# Patient Record
Sex: Female | Born: 1935
Health system: Southern US, Community
[De-identification: ages and names within clinical notes are randomized; demographics above are authoritative.]

---

## 2014-09-13 ENCOUNTER — Inpatient Hospital Stay
Admission: AD | Admit: 2014-09-13 | Discharge: 2014-11-23 | Disposition: E | Payer: Self-pay | Source: Other Acute Inpatient Hospital | Attending: Internal Medicine | Admitting: Internal Medicine

## 2014-09-13 DIAGNOSIS — R609 Edema, unspecified: Secondary | ICD-10-CM

## 2014-09-13 DIAGNOSIS — R0603 Acute respiratory distress: Secondary | ICD-10-CM

## 2014-09-13 DIAGNOSIS — R0602 Shortness of breath: Secondary | ICD-10-CM

## 2014-09-13 DIAGNOSIS — J189 Pneumonia, unspecified organism: Secondary | ICD-10-CM

## 2014-09-13 DIAGNOSIS — E8770 Fluid overload, unspecified: Secondary | ICD-10-CM

## 2014-09-13 DIAGNOSIS — H05342 Enlargement of left orbit: Secondary | ICD-10-CM

## 2014-09-13 DIAGNOSIS — N009 Acute nephritic syndrome with unspecified morphologic changes: Secondary | ICD-10-CM

## 2014-09-13 DIAGNOSIS — J9690 Respiratory failure, unspecified, unspecified whether with hypoxia or hypercapnia: Secondary | ICD-10-CM

## 2014-09-13 DIAGNOSIS — Z431 Encounter for attention to gastrostomy: Secondary | ICD-10-CM

## 2014-09-13 DIAGNOSIS — J9600 Acute respiratory failure, unspecified whether with hypoxia or hypercapnia: Secondary | ICD-10-CM

## 2014-09-13 DIAGNOSIS — L700 Acne vulgaris: Secondary | ICD-10-CM

## 2014-09-14 ENCOUNTER — Other Ambulatory Visit (HOSPITAL_COMMUNITY): Payer: Self-pay

## 2014-09-14 LAB — CBC
HEMATOCRIT: 27.7 % — AB (ref 36.0–46.0)
Hemoglobin: 8.4 g/dL — ABNORMAL LOW (ref 12.0–15.0)
MCH: 30.4 pg (ref 26.0–34.0)
MCHC: 30.3 g/dL (ref 30.0–36.0)
MCV: 100.4 fL — AB (ref 78.0–100.0)
Platelets: 253 10*3/uL (ref 150–400)
RBC: 2.76 MIL/uL — ABNORMAL LOW (ref 3.87–5.11)
RDW: 16.9 % — ABNORMAL HIGH (ref 11.5–15.5)
WBC: 6.2 10*3/uL (ref 4.0–10.5)

## 2014-09-14 LAB — COMPREHENSIVE METABOLIC PANEL
ALBUMIN: 2.4 g/dL — AB (ref 3.5–5.0)
ALT: 23 U/L (ref 14–54)
AST: 20 U/L (ref 15–41)
Alkaline Phosphatase: 75 U/L (ref 38–126)
Anion gap: 9 (ref 5–15)
BUN: 17 mg/dL (ref 6–20)
CALCIUM: 8.8 mg/dL — AB (ref 8.9–10.3)
CO2: 29 mmol/L (ref 22–32)
CREATININE: 1.92 mg/dL — AB (ref 0.44–1.00)
Chloride: 100 mmol/L — ABNORMAL LOW (ref 101–111)
GFR calc Af Amer: 27 mL/min — ABNORMAL LOW (ref >60)
GFR calc non Af Amer: 24 mL/min — ABNORMAL LOW (ref >60)
Glucose, Bld: 88 mg/dL (ref 65–99)
Potassium: 4.7 mmol/L (ref 3.5–5.1)
Sodium: 138 mmol/L (ref 135–145)
TOTAL PROTEIN: 5.3 g/dL — AB (ref 6.5–8.1)
Total Bilirubin: 0.5 mg/dL (ref 0.3–1.2)

## 2014-09-14 MED ORDER — IOHEXOL 300 MG/ML  SOLN
50.0000 mL | Freq: Once | INTRAMUSCULAR | Status: AC | PRN
  Administered 2014-09-14: 50 mL via INTRAVENOUS

## 2014-09-15 ENCOUNTER — Other Ambulatory Visit (HOSPITAL_COMMUNITY): Payer: Self-pay

## 2014-09-15 MED ORDER — LIDOCAINE VISCOUS 2 % MT SOLN
OROMUCOSAL | Status: AC
  Filled 2014-09-15: qty 15

## 2014-09-15 MED ORDER — IOHEXOL 300 MG/ML  SOLN
50.0000 mL | Freq: Once | INTRAMUSCULAR | Status: AC | PRN
  Administered 2014-09-15: 10 mL via INTRAVENOUS

## 2014-09-15 NOTE — Procedures (Signed)
Successful fluoroscopic guided replacement of 18 Fr gastrostomy tube.  No immediate post procedural complications.  The feeding tube is ready for immediate use. 

## 2014-09-15 NOTE — Consult Note (Signed)
[facility] CONSULT NOTE    Date: 09/15/2014                  Patient Name:  Bailey Calderon  MRN: [id number]  DOB: September 21, 1933  Age / Sex: 81 y.o., female         PCP: No PCP Per Patient                 Service Requesting Consult: Internal Medicine                 Reason for Consult: Recent Acute renal failure            History of Present Illness: Patient is a 81 y.o. female with a PMHx of myeloma status post autologous stem cell transplantation, who was admitted to [place] [facility] on 08/23/2014 for ongoing care. The patient chief as well as position and [facility]. 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 She was apparently on dialysis for a period of time however her creatinine appears to be trending down now. She has a PermCath in place. She has good urine output however. We are asked evaluate her for dialysis today.   Medications: Outpatient medications: No prescriptions prior to admission    Current medications: Current Facility-Administered Medications  Medication Dose Route Frequency Provider Last Rate Last Dose  . lidocaine (XYLOCAINE) 2 % viscous mouth solution             Amiodarone 266m po daily Aspirin 868mpo daily Atorvastatin 2065KPo qhs Folic acid 12m75mo daily Loratadine 29m17ms Methylprednisolone 30mg62mBID Senna 8.6mg p57mid Thiamine 100mg p32mily Vitamin D 3000 IU daily  Allergies: No known drug allergies    Past Medical History: Acute renal failure due to acute glomerulonephritis syndrome with immune complex deposition in setting of type III hypersensitiviy reaction Atrial fibrillation Acute respiratory failure Aspiration pneumonia   Past Surgical History: Bilateral cataract removal Right carpal tunnel release T12 and L2 vertebroplasty Vaginal hysterectomy secondary  to uterine prolapse Status post tonsillectomy 1942   Family History: Unable to obtain directly from the patient.  Social History: Married, lives with her husband with dementia.  Review of Systems: Pt poor historian and unable to concentrate on ROS questions and unable to provide accurate ROS.    Vital Signs: 97.681 22 152/84 Weight trends: There were no vitals filed for this visit.  Physical Exam: General: Frail appearing  Head: Normocephalic, atraumatic.  Eyes: Anicteric, EOMI, PERRL  Nose: Mucous membranes moist, not inflammed, nonerythematous.  Throat: Oropharynx nonerythematous, no exudate appreciated.   Neck: Supple, trachea midline  Lungs:  Basilar rales, normal effort  Heart: RRR. S1 and S2 2/6 SEM.  Abdomen:  BS normoactive. Soft, Nondistended, non-tender.  PEG present  Extremities: 1+ b/l LE edema.  Neurologic: Awake, alert, follows simple commands  Skin: No visible rashes, scars.   Psych:            Normal affect, limited insight  Lab results: Basic Metabolic Panel:  Recent Labs Lab 09/14/14 0623  NA 138  K 4.7  CL 100*  CO2 29  GLUCOSE 88  BUN 17  CREATININE 1.92*  CALCIUM 8.8*    Liver Function Tests:  Recent Labs Lab 09/14/14 0623  AST 20  ALT 23  ALKPHOS 75  BILITOT 0.5  PROT 5.3*  ALBUMIN 2.4*   No results  for input(s): LIPASE, AMYLASE in the last 168 hours. No results for input(s): AMMONIA in the last 168 hours.  CBC:  Recent Labs Lab 09/14/14 0623  WBC 6.2  HGB 8.4*  HCT 27.7*  MCV 100.4*  PLT 253    Cardiac Enzymes: No results for input(s): CKTOTAL, CKMB, CKMBINDEX, TROPONINI in the last 168 hours.  BNP: Invalid input(s): POCBNP  CBG: No results for input(s): GLUCAP in the last 168 hours.  Microbiology: No results found for this or any previous visit.  Coagulation Studies: No results for input(s): LABPROT, INR in the last 72 hours.  Urinalysis: No results for input(s): COLORURINE, LABSPEC, PHURINE, GLUCOSEU,  HGBUR, BILIRUBINUR, KETONESUR, PROTEINUR, UROBILINOGEN, NITRITE, LEUKOCYTESUR in the last 72 hours.  Invalid input(s): APPERANCEUR    Imaging: Ir Replc Gastro/colonic Tube Percut W/fluoro  09/15/2014   INDICATION: Gastrostomy tube fell out. Gastrostomy tract maintained with a Foley catheter. Please performed fluoroscopic guided gastrostomy tube exchange  EXAM: FLUOROSCOPIC GUIDED REPLACEMENT OF GASTROSTOMY TUBE  COMPARISON:  Abdominal radiograph- 09/14/2014  MEDICATIONS: None.  CONTRAST:  71m OMNIPAQUE IOHEXOL 300 MG/ML SOLN administered into the gastric lumen  FLUOROSCOPY TIME:  36 seconds (21 mGy)  COMPLICATIONS: None immediate  PROCEDURE: Informed written consent was obtained from the patient after a discussion of the risks, benefits and alternatives to treatment. Questions regarding the procedure were encouraged and answered. A timeout was performed prior to the initiation of the procedure.  The upper abdomen and external portion of the existing gastrostomy tube was prepped and draped in the usual sterile fashion, and a sterile drape was applied covering the operative field. Maximum barrier sterile technique with sterile gowns and gloves were used for the procedure. A timeout was performed prior to the initiation of the procedure.  The existing gastrostomy tube was injected with a small amount of contrast confirming appropriate positioning within the gastric lumen. The existing gastrostomy tube was cannulated with a stiff Glidewire which was coiled within the gastric fundus. Over the stiff guide wire, the existing gastrostomy tube was removed and exchanged for a new 20-French MIC balloon inflatable gastrostomy tube. The balloon was inflated and disc was cinched. Contrast was injected and several spot fluoroscopic images were obtained in various obliquities to confirm appropriate intraluminal positioning. A dressing was placed. The patient tolerated the procedure well without immediate postprocedural  complication.  IMPRESSION: Successful fluoroscopic guided replacement of a new 20-French gastrostomy tube. The new gastrostomy tube is ready for immediate use.   Electronically Signed   By: JSandi MariscalM.D.   On: 09/15/2014 12:49   Dg Chest Port 1 View  09/14/2014   CLINICAL DATA:  Acute renal failure.  Respiratory failure  EXAM: PORTABLE CHEST - 1 VIEW  COMPARISON:  None.  FINDINGS: Right jugular catheter tips at the cavoatrial junction. No pneumothorax.  Mild bibasilar atelectasis. Negative for pulmonary edema or effusion. Negative for pneumonia.  Prior cement vertebral augmentation at multiple levels of the thoracic spine  IMPRESSION: Central venous catheter tip at the cavoatrial junction. Negative for edema.  Mild bibasilar atelectasis.   Electronically Signed   By: CFranchot GalloM.D.   On: 09/14/2014 08:01   Dg Abd Portable 1v  09/14/2014   CLINICAL DATA:  Evaluate PEG tube placement  EXAM: PORTABLE ABDOMEN - 1 VIEW  COMPARISON:  None.  FINDINGS: A portable film of the abdomen shows contrast has been injected via the PEG tube, by history 50 cc of Omnipaque 300 were injected. The contrast does fill a portion of  the stomach and extends into the small bowel. Therefore no extravasation is seen. The bowel gas pattern is nonspecific.  IMPRESSION: The tip of the PEG tube appears to be with within the stomach with no extravasation noted.   Electronically Signed   By: Ivar Drape M.D.   On: 09/14/2014 07:57      Assessment & Plan: Pt is a 81 y.o. yo female with a PMHX of multiple myeloma status post autologous stem cell transplantation, recent acute renal failure secondary to acute glomerulonephritis syndrome with immune complex deposition in the setting of type III hypersensitivity reaction, recent acute respiratory failure, was [facility]/2014 for ongoing management.   1. Acute renal failure secondary to glomerulonephritis syndrome with immune complex deposition in  the setting of type III hypersensitivity reaction. 2. Anemia unspecified.  Plan: We do not have the discharge summary from [facility] to review at this point in time however it appears that she was found to have acute renal failure secondary to glomerulonephritis syndrome with an incompetent position the setting of type III hypersensitivity reaction. The patient is noted to be on steroid therapy at this time. The patient appears to have improving renal function and has good urine output. Therefore no acute indication for dialysis at present. We'll monitor renal function trend over the weekend.  We will reassess renal function on Monday. Patient also noted to be anemic in the setting of recent acute illness and history of multiple myeloma. We'll continue to follow CBC and transfuse for hemoglobin of 7 or less. Thanks for consult.

## 2014-09-16 LAB — BASIC METABOLIC PANEL
Anion gap: 8 (ref 5–15)
BUN: 47 mg/dL — AB (ref 6–20)
CO2: 29 mmol/L (ref 22–32)
CREATININE: 3.53 mg/dL — AB (ref 0.44–1.00)
Calcium: 8.3 mg/dL — ABNORMAL LOW (ref 8.9–10.3)
Chloride: 96 mmol/L — ABNORMAL LOW (ref 101–111)
GFR calc Af Amer: 13 mL/min — ABNORMAL LOW (ref >60)
GFR, EST NON AFRICAN AMERICAN: 11 mL/min — AB (ref >60)
Glucose, Bld: 144 mg/dL — ABNORMAL HIGH (ref 65–99)
Potassium: 5.4 mmol/L — ABNORMAL HIGH (ref 3.5–5.1)
SODIUM: 133 mmol/L — AB (ref 135–145)

## 2014-09-16 LAB — BRAIN NATRIURETIC PEPTIDE: B NATRIURETIC PEPTIDE 5: 1964.4 pg/mL — AB (ref 0.0–100.0)

## 2014-09-16 LAB — CBC
HCT: 28.4 % — ABNORMAL LOW (ref 36.0–46.0)
Hemoglobin: 8.7 g/dL — ABNORMAL LOW (ref 12.0–15.0)
MCH: 29.9 pg (ref 26.0–34.0)
MCHC: 30.6 g/dL (ref 30.0–36.0)
MCV: 97.6 fL (ref 78.0–100.0)
Platelets: 291 10*3/uL (ref 150–400)
RBC: 2.91 MIL/uL — AB (ref 3.87–5.11)
RDW: 16.4 % — AB (ref 11.5–15.5)
WBC: 12.4 10*3/uL — AB (ref 4.0–10.5)

## 2014-09-17 LAB — BASIC METABOLIC PANEL
ANION GAP: 6 (ref 5–15)
BUN: 23 mg/dL — ABNORMAL HIGH (ref 6–20)
CO2: 30 mmol/L (ref 22–32)
CREATININE: 1.87 mg/dL — AB (ref 0.44–1.00)
Calcium: 7.6 mg/dL — ABNORMAL LOW (ref 8.9–10.3)
Chloride: 98 mmol/L — ABNORMAL LOW (ref 101–111)
GFR calc non Af Amer: 24 mL/min — ABNORMAL LOW (ref >60)
GFR, EST AFRICAN AMERICAN: 28 mL/min — AB (ref >60)
Glucose, Bld: 127 mg/dL — ABNORMAL HIGH (ref 65–99)
POTASSIUM: 4.7 mmol/L (ref 3.5–5.1)
SODIUM: 134 mmol/L — AB (ref 135–145)

## 2014-09-17 LAB — HEPATITIS B CORE ANTIBODY, IGM: Hep B C IgM: NEGATIVE

## 2014-09-17 LAB — HEPATITIS B SURFACE ANTIGEN: HEP B S AG: NEGATIVE

## 2014-09-17 LAB — HEPATITIS B SURFACE ANTIBODY,QUALITATIVE: Hep B S Ab: NONREACTIVE

## 2014-09-18 LAB — CBC
HCT: 29.4 % — ABNORMAL LOW (ref 36.0–46.0)
HEMATOCRIT: 29.8 % — AB (ref 36.0–46.0)
HEMOGLOBIN: 9.1 g/dL — AB (ref 12.0–15.0)
Hemoglobin: 9 g/dL — ABNORMAL LOW (ref 12.0–15.0)
MCH: 30.6 pg (ref 26.0–34.0)
MCH: 30.3 pg (ref 26.0–34.0)
MCHC: 30.5 g/dL (ref 30.0–36.0)
MCHC: 30.6 g/dL (ref 30.0–36.0)
MCV: 100.3 fL — ABNORMAL HIGH (ref 78.0–100.0)
MCV: 99 fL (ref 78.0–100.0)
Platelets: 257 10*3/uL (ref 150–400)
Platelets: 305 10*3/uL (ref 150–400)
RBC: 2.97 MIL/uL — ABNORMAL LOW (ref 3.87–5.11)
RBC: 2.97 MIL/uL — AB (ref 3.87–5.11)
RDW: 16.2 % — AB (ref 11.5–15.5)
RDW: 16 % — AB (ref 11.5–15.5)
WBC: 10.8 10*3/uL — AB (ref 4.0–10.5)
WBC: 10.9 10*3/uL — ABNORMAL HIGH (ref 4.0–10.5)

## 2014-09-18 LAB — RENAL FUNCTION PANEL
ANION GAP: 6 (ref 5–15)
Albumin: 2.6 g/dL — ABNORMAL LOW (ref 3.5–5.0)
Albumin: 2.6 g/dL — ABNORMAL LOW (ref 3.5–5.0)
Anion gap: 8 (ref 5–15)
BUN: 49 mg/dL — ABNORMAL HIGH (ref 6–20)
BUN: 51 mg/dL — ABNORMAL HIGH (ref 6–20)
CHLORIDE: 96 mmol/L — AB (ref 101–111)
CO2: 27 mmol/L (ref 22–32)
CO2: 28 mmol/L (ref 22–32)
CREATININE: 2.69 mg/dL — AB (ref 0.44–1.00)
CREATININE: 2.7 mg/dL — AB (ref 0.44–1.00)
Calcium: 7.4 mg/dL — ABNORMAL LOW (ref 8.9–10.3)
Calcium: 7.4 mg/dL — ABNORMAL LOW (ref 8.9–10.3)
Chloride: 94 mmol/L — ABNORMAL LOW (ref 101–111)
GFR calc Af Amer: 18 mL/min — ABNORMAL LOW (ref >60)
GFR calc Af Amer: 18 mL/min — ABNORMAL LOW (ref >60)
GFR calc non Af Amer: 16 mL/min — ABNORMAL LOW (ref >60)
GFR, EST NON AFRICAN AMERICAN: 16 mL/min — AB (ref >60)
GLUCOSE: 154 mg/dL — AB (ref 65–99)
Glucose, Bld: 142 mg/dL — ABNORMAL HIGH (ref 65–99)
Phosphorus: 5.2 mg/dL — ABNORMAL HIGH (ref 2.5–4.6)
Phosphorus: 5.2 mg/dL — ABNORMAL HIGH (ref 2.5–4.6)
Potassium: 6.5 mmol/L (ref 3.5–5.1)
Potassium: 5 mmol/L (ref 3.5–5.1)
SODIUM: 130 mmol/L — AB (ref 135–145)
Sodium: 129 mmol/L — ABNORMAL LOW (ref 135–145)

## 2014-09-18 LAB — POTASSIUM: Potassium: 5 mmol/L (ref 3.5–5.1)

## 2014-09-18 NOTE — Progress Notes (Signed)
  Subjective:   Doing fair Sitting up in chair No acute c/o   Objective:  Vital signs in last 24 hours:   97.4  74  20  124/65  Weight change:  There were no vitals filed for this visit.  Intake/Output:       Physical Exam: General: NAD, sitting up  HEENT Moist mucus membranes  Neck supple  Pulm/lungs Clear b/l  CVS/Heart Regular, no rub  Abdomen:  Soft, non deistended, PEG ion place  Extremities: No significant edema  Neurologic: Alert, non focal  Skin: No acute rashes  Access: Rt Ij permcath       Basic Metabolic Panel:  Recent Labs Lab 09/14/14 0623 09/16/14 0430 09/17/14 0630 09/18/14 0601 09/18/14 0824  NA 138 133* 134* 129* 130*  K 4.7 5.4* 4.7 6.5* 5.0  5.0  CL 100* 96* 98* 96* 94*  CO2 $Re'29 29 30 27 28  'tjL$ GLUCOSE 88 144* 127* 154* 142*  BUN 17 47* 23* 49* 51*  CREATININE 1.92* 3.53* 1.87* 2.69* 2.70*  CALCIUM 8.8* 8.3* 7.6* 7.4* 7.4*  PHOS  --   --   --  5.2* 5.2*     CBC:  Recent Labs Lab 09/14/14 0623 09/16/14 0430 09/18/14 0601 09/18/14 0824  WBC 6.2 12.4* 10.8* 10.9*  HGB 8.4* 8.7* 9.1* 9.0*  HCT 27.7* 28.4* 29.8* 29.4*  MCV 100.4* 97.6 100.3* 99.0  PLT 253 291 257 305      Microbiology: No results found for this or any previous visit.  Coagulation Studies: No results for input(s): LABPROT, INR in the last 72 hours.  Urinalysis: No results for input(s): COLORURINE, LABSPEC, PHURINE, GLUCOSEU, HGBUR, BILIRUBINUR, KETONESUR, PROTEINUR, UROBILINOGEN, NITRITE, LEUKOCYTESUR in the last 72 hours.  Invalid input(s): APPERANCEUR    Imaging: No results found.   Medications:   solumedrol    Assessment/ Plan:  81 y.o. 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   1. Acute renal failure secondary to glomerulonephritis syndrome with immune complex deposition in the setting of type III hypersensitivity reaction. 2. Anemia unspecified.  Plan: Steroids Dialysis today then hold and monitor UOP Supportive care   LOS:  Bailey Calderon 6/27/20162:15 PM

## 2014-09-20 LAB — CBC
HCT: 29.5 % — ABNORMAL LOW (ref 36.0–46.0)
Hemoglobin: 9 g/dL — ABNORMAL LOW (ref 12.0–15.0)
MCH: 30.5 pg (ref 26.0–34.0)
MCHC: 30.5 g/dL (ref 30.0–36.0)
MCV: 100 fL (ref 78.0–100.0)
PLATELETS: 286 10*3/uL (ref 150–400)
RBC: 2.95 MIL/uL — ABNORMAL LOW (ref 3.87–5.11)
RDW: 16.3 % — AB (ref 11.5–15.5)
WBC: 9.8 10*3/uL (ref 4.0–10.5)

## 2014-09-20 LAB — RENAL FUNCTION PANEL
Albumin: 2.6 g/dL — ABNORMAL LOW (ref 3.5–5.0)
Anion gap: 8 (ref 5–15)
BUN: 52 mg/dL — ABNORMAL HIGH (ref 6–20)
CALCIUM: 7.9 mg/dL — AB (ref 8.9–10.3)
CO2: 31 mmol/L (ref 22–32)
Chloride: 98 mmol/L — ABNORMAL LOW (ref 101–111)
Creatinine, Ser: 2.72 mg/dL — ABNORMAL HIGH (ref 0.44–1.00)
GFR calc Af Amer: 18 mL/min — ABNORMAL LOW (ref >60)
GFR calc non Af Amer: 16 mL/min — ABNORMAL LOW (ref >60)
GLUCOSE: 107 mg/dL — AB (ref 65–99)
PHOSPHORUS: 4.3 mg/dL (ref 2.5–4.6)
POTASSIUM: 4.2 mmol/L (ref 3.5–5.1)
Sodium: 137 mmol/L (ref 135–145)

## 2014-09-20 NOTE — Progress Notes (Signed)
  Subjective:   Doing fair Sitting up in chair No acute c/o TF NEPRO @ 50 cc/hr  Objective:  Vital signs in last 24 hours:   97.3  86  20  140/80  Weight change:  There were no vitals filed for this visit.  Intake/Output:   ? 225 cc     Physical Exam: General: NAD, sitting up  HEENT Moist mucus membranes  Neck supple  Pulm/lungs Clear b/l  CVS/Heart Regular, no rub  Abdomen:  Soft, non deistended, PEG in place  Extremities: No significant edema  Neurologic: Alert, non focal  Skin: No acute rashes  Access: Rt Ij permcath       Basic Metabolic Panel:  Recent Labs Lab 09/16/14 0430 09/17/14 0630 09/18/14 0601 09/18/14 0824 09/20/14 0445  NA 133* 134* 129* 130* 137  K 5.4* 4.7 6.5* 5.0  5.0 4.2  CL 96* 98* 96* 94* 98*  CO2 $Re'29 30 27 28 31  'UAF$ GLUCOSE 144* 127* 154* 142* 107*  BUN 47* 23* 49* 51* 52*  CREATININE 3.53* 1.87* 2.69* 2.70* 2.72*  CALCIUM 8.3* 7.6* 7.4* 7.4* 7.9*  PHOS  --   --  5.2* 5.2* 4.3     CBC:  Recent Labs Lab 09/14/14 0623 09/16/14 0430 09/18/14 0601 09/18/14 0824 09/20/14 0445  WBC 6.2 12.4* 10.8* 10.9* 9.8  HGB 8.4* 8.7* 9.1* 9.0* 9.0*  HCT 27.7* 28.4* 29.8* 29.4* 29.5*  MCV 100.4* 97.6 100.3* 99.0 100.0  PLT 253 291 257 305 286      Microbiology: No results found for this or any previous visit.  Coagulation Studies: No results for input(s): LABPROT, INR in the last 72 hours.  Urinalysis: No results for input(s): COLORURINE, LABSPEC, PHURINE, GLUCOSEU, HGBUR, BILIRUBINUR, KETONESUR, PROTEINUR, UROBILINOGEN, NITRITE, LEUKOCYTESUR in the last 72 hours.  Invalid input(s): APPERANCEUR    Imaging: No results found.   Medications:   solumedrol    Assessment/ Plan:  81 y.o. 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   1. Acute renal failure secondary to glomerulonephritis syndrome with immune complex deposition in the setting of type III hypersensitivity reaction. 2. Anemia unspecified.  Plan: Steroids- Prednisone 20 mg po daily Electrolytes and Volume status are acceptable No acute indication for Dialysis at present  Monitor lytes daily   LOS:  [facility] 6/29/20163:06 PM

## 2014-09-22 ENCOUNTER — Other Ambulatory Visit (HOSPITAL_COMMUNITY): Payer: Self-pay

## 2014-09-22 LAB — RENAL FUNCTION PANEL
Albumin: 2.3 g/dL — ABNORMAL LOW (ref 3.5–5.0)
Anion gap: 7 (ref 5–15)
BUN: 83 mg/dL — ABNORMAL HIGH (ref 6–20)
CHLORIDE: 97 mmol/L — AB (ref 101–111)
CO2: 31 mmol/L (ref 22–32)
Calcium: 8.6 mg/dL — ABNORMAL LOW (ref 8.9–10.3)
Creatinine, Ser: 3.64 mg/dL — ABNORMAL HIGH (ref 0.44–1.00)
GFR calc Af Amer: 13 mL/min — ABNORMAL LOW (ref >60)
GFR, EST NON AFRICAN AMERICAN: 11 mL/min — AB (ref >60)
Glucose, Bld: 118 mg/dL — ABNORMAL HIGH (ref 65–99)
Phosphorus: 5.6 mg/dL — ABNORMAL HIGH (ref 2.5–4.6)
Potassium: 4.6 mmol/L (ref 3.5–5.1)
Sodium: 135 mmol/L (ref 135–145)

## 2014-09-22 LAB — CBC
HEMATOCRIT: 28.3 % — AB (ref 36.0–46.0)
Hemoglobin: 8.5 g/dL — ABNORMAL LOW (ref 12.0–15.0)
MCH: 29.9 pg (ref 26.0–34.0)
MCHC: 30 g/dL (ref 30.0–36.0)
MCV: 99.6 fL (ref 78.0–100.0)
PLATELETS: 237 10*3/uL (ref 150–400)
RBC: 2.84 MIL/uL — ABNORMAL LOW (ref 3.87–5.11)
RDW: 16.4 % — ABNORMAL HIGH (ref 11.5–15.5)
WBC: 10.3 10*3/uL (ref 4.0–10.5)

## 2014-09-22 LAB — MAGNESIUM: MAGNESIUM: 2.4 mg/dL (ref 1.7–2.4)

## 2014-09-22 NOTE — Progress Notes (Signed)
  Subjective:   Doing fair Sleeping in bed this AM No acute c/o TF NEPRO   Objective:  Vital signs in last 24 hours:   97.1  86  18  139/64  Weight change:  There were no vitals filed for this visit.  Intake/Output:   uop 700 cc     Physical Exam: General: NAD, laying in bed  HEENT Dry oral mucus membranes  Neck supple  Pulm/lungs Clear b/l  CVS/Heart Regular, no rub  Abdomen:  Soft, non deistended, PEG in place  Extremities: No significant edema  Neurologic: Alert, non focal  Skin: No acute rashes  Access: Rt Ij permcath       Basic Metabolic Panel:  Recent Labs Lab 09/17/14 0630 09/18/14 0601 09/18/14 0824 09/20/14 0445 09/22/14 0608  NA 134* 129* 130* 137 135  K 4.7 6.5* 5.0  5.0 4.2 4.6  CL 98* 96* 94* 98* 97*  CO2 $Re'30 27 28 31 31  'Ygo$ GLUCOSE 127* 154* 142* 107* 118*  BUN 23* 49* 51* 52* 83*  CREATININE 1.87* 2.69* 2.70* 2.72* 3.64*  CALCIUM 7.6* 7.4* 7.4* 7.9* 8.6*  MG  --   --   --   --  2.4  PHOS  --  5.2* 5.2* 4.3 5.6*     CBC:  Recent Labs Lab 09/16/14 0430 09/18/14 0601 09/18/14 0824 09/20/14 0445 09/22/14 0608  WBC 12.4* 10.8* 10.9* 9.8 10.3  HGB 8.7* 9.1* 9.0* 9.0* 8.5*  HCT 28.4* 29.8* 29.4* 29.5* 28.3*  MCV 97.6 100.3* 99.0 100.0 99.6  PLT 291 257 305 286 237      Microbiology: No results found for this or any previous visit.  Coagulation Studies: No results for input(s): LABPROT, INR in the last 72 hours.  Urinalysis: No results for input(s): COLORURINE, LABSPEC, PHURINE, GLUCOSEU, HGBUR, BILIRUBINUR, KETONESUR, PROTEINUR, UROBILINOGEN, NITRITE, LEUKOCYTESUR in the last 72 hours.  Invalid input(s): APPERANCEUR    Imaging: No results found.   Medications:   Prednisone    Assessment/ Plan:  81 y.o. 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   1. Acute renal failure secondary to glomerulonephritis syndrome with immune complex deposition in the setting of type III hypersensitivity reaction. 2. Anemia unspecified.  Plan: Steroids- Prednisone 20 mg po daily Electrolytes and Volume status are acceptable No acute indication for Dialysis at present, but with rising BUN, may need it on Monday Monitor lytes daily   LOS:  Bailey Calderon 7/1/20168:04 AM

## 2014-09-22 DEATH — deceased

## 2014-09-24 ENCOUNTER — Other Ambulatory Visit (HOSPITAL_COMMUNITY): Payer: Self-pay

## 2014-09-25 LAB — CBC
HEMATOCRIT: 28 % — AB (ref 36.0–46.0)
Hemoglobin: 8.5 g/dL — ABNORMAL LOW (ref 12.0–15.0)
MCH: 29.8 pg (ref 26.0–34.0)
MCHC: 30.4 g/dL (ref 30.0–36.0)
MCV: 98.2 fL (ref 78.0–100.0)
Platelets: 221 10*3/uL (ref 150–400)
RBC: 2.85 MIL/uL — ABNORMAL LOW (ref 3.87–5.11)
RDW: 16.1 % — ABNORMAL HIGH (ref 11.5–15.5)
WBC: 8.1 10*3/uL (ref 4.0–10.5)

## 2014-09-25 LAB — RENAL FUNCTION PANEL
Albumin: 2.7 g/dL — ABNORMAL LOW (ref 3.5–5.0)
Anion gap: 10 (ref 5–15)
BUN: 104 mg/dL — AB (ref 6–20)
CALCIUM: 9.1 mg/dL (ref 8.9–10.3)
CHLORIDE: 94 mmol/L — AB (ref 101–111)
CO2: 30 mmol/L (ref 22–32)
Creatinine, Ser: 3.93 mg/dL — ABNORMAL HIGH (ref 0.44–1.00)
GFR, EST AFRICAN AMERICAN: 12 mL/min — AB (ref >60)
GFR, EST NON AFRICAN AMERICAN: 10 mL/min — AB (ref >60)
GLUCOSE: 108 mg/dL — AB (ref 65–99)
PHOSPHORUS: 5.8 mg/dL — AB (ref 2.5–4.6)
POTASSIUM: 4.6 mmol/L (ref 3.5–5.1)
Sodium: 134 mmol/L — ABNORMAL LOW (ref 135–145)

## 2014-09-25 LAB — BRAIN NATRIURETIC PEPTIDE: B NATRIURETIC PEPTIDE 5: 945.9 pg/mL — AB (ref 0.0–100.0)

## 2014-09-25 NOTE — Progress Notes (Signed)
  Subjective:  Resting in bed comfortably. Daugther at bedside. Azotemia worsening.  UOP 1000cc over the past 24 hours.   Objective:  Vital signs in last 24 hours:  97.6 70 20 13280  Weight change:  There were no vitals filed for this visit.  Intake/Output: UOP 1000cc   Physical Exam: General: NAD, laying in bed  HEENT [place]/AT EOMI hearing intact OM moist  Neck supple  Pulm/lungs Clear b/l normal effort  CVS/Heart Regular, no rub  Abdomen:  Soft, non distended, PEG in place  Extremities: 1+ b/l LE edema  Neurologic: Alert, non focal  Skin: No acute rashes  Access: Rt IJ permcath       Basic Metabolic Panel:  Recent Labs Lab 09/20/14 0445 09/22/14 0608 09/25/14 0528  NA 137 135 134*  K 4.2 4.6 4.6  CL 98* 97* 94*  CO2 $Re'31 31 30  'COy$ GLUCOSE 107* 118* 108*  BUN 52* 83* 104*  CREATININE 2.72* 3.64* 3.93*  CALCIUM 7.9* 8.6* 9.1  MG  --  2.4  --   PHOS 4.3 5.6* 5.8*     CBC:  Recent Labs Lab 09/20/14 0445 09/22/14 0608 09/25/14 0528  WBC 9.8 10.3 8.1  HGB 9.0* 8.5* 8.5*  HCT 29.5* 28.3* 28.0*  MCV 100.0 99.6 98.2  PLT 286 237 221      Microbiology: No results found for this or any previous visit.  Coagulation Studies: No results for input(s): LABPROT, INR in the last 72 hours.  Urinalysis: No results for input(s): COLORURINE, LABSPEC, PHURINE, GLUCOSEU, HGBUR, BILIRUBINUR, KETONESUR, PROTEINUR, UROBILINOGEN, NITRITE, LEUKOCYTESUR in the last 72 hours.  Invalid input(s): APPERANCEUR    Imaging: Dg Chest Port 1 View  09/24/2014   CLINICAL DATA:  Increased shortness of breath and cough.  EXAM: PORTABLE CHEST - 1 VIEW  COMPARISON:  09/14/2014 chest radiograph.  FINDINGS: RIGHT IJ dialysis catheter appears unchanged. Patient is rotated to the RIGHT. High density is present in the RIGHT upper quadrant, probably representing ingested barium contrast. There appears to be a gastrostomy tube in the RIGHT upper quadrant.  No free air is present underneath the  hemidiaphragms. The cardiopericardial silhouette appears mildly enlarged. Lung volumes are low. Chronic pulmonary parenchymal scarring. Retrocardiac density is present, likely representing atelectasis and effusion.  IMPRESSION: Cardiomegaly with increased retrocardiac opacity, likely representing atelectasis and effusion. Superimposed airspace disease cannot be excluded.   Electronically Signed   By: Dereck Ligas M.D.   On: 09/24/2014 10:54     Medications:   Prednisone    Assessment/ Plan:  81 y.o. 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   1. Acute renal failure secondary to glomerulonephritis syndrome with immune complex deposition in the setting of type III hypersensitivity reaction. 2. Anemia unspecified. 3. Secondary hyperparathyroidism.  Plan: Azotemia appears to be worsening at the moment. Good UOP noted however.  Phos up to 5.8 as well.  Will therefore restart the patient on dialysis. Will plan for shorter HD treatment today, 2 hours, BFR 200/DFR 400, UF 1kg.  Thereafter next HD to be on Wednesday. Hgb 8.5, therefore will start aranesp 67mcg  weekly.  LOS:  Elianie Hubers 7/4/20161:51 PM

## 2014-09-27 ENCOUNTER — Other Ambulatory Visit (HOSPITAL_COMMUNITY): Payer: Self-pay

## 2014-09-27 LAB — CBC
HCT: 26 % — ABNORMAL LOW (ref 36.0–46.0)
HEMOGLOBIN: 7.8 g/dL — AB (ref 12.0–15.0)
MCH: 30.4 pg (ref 26.0–34.0)
MCHC: 30 g/dL (ref 30.0–36.0)
MCV: 101.2 fL — AB (ref 78.0–100.0)
Platelets: 199 10*3/uL (ref 150–400)
RBC: 2.57 MIL/uL — ABNORMAL LOW (ref 3.87–5.11)
RDW: 15.9 % — ABNORMAL HIGH (ref 11.5–15.5)
WBC: 8.3 10*3/uL (ref 4.0–10.5)

## 2014-09-27 LAB — RENAL FUNCTION PANEL
ANION GAP: 6 (ref 5–15)
Albumin: 2.4 g/dL — ABNORMAL LOW (ref 3.5–5.0)
BUN: 78 mg/dL — AB (ref 6–20)
CALCIUM: 8.5 mg/dL — AB (ref 8.9–10.3)
CO2: 34 mmol/L — ABNORMAL HIGH (ref 22–32)
Chloride: 97 mmol/L — ABNORMAL LOW (ref 101–111)
Creatinine, Ser: 2.96 mg/dL — ABNORMAL HIGH (ref 0.44–1.00)
GFR calc non Af Amer: 14 mL/min — ABNORMAL LOW (ref >60)
GFR, EST AFRICAN AMERICAN: 16 mL/min — AB (ref >60)
Glucose, Bld: 96 mg/dL (ref 65–99)
Phosphorus: 3.3 mg/dL (ref 2.5–4.6)
Potassium: 4.3 mmol/L (ref 3.5–5.1)
SODIUM: 137 mmol/L (ref 135–145)

## 2014-09-27 NOTE — Progress Notes (Signed)
  Subjective:  Patient seen during dialysis Tolerating well  Tube feeds at 40 cc/hr  Objective:  Vital signs in last 24 hours:  116/61  72  97.9    Weight change:  There were no vitals filed for this visit.  Intake/Output: UOP  750 cc   Physical Exam: General: NAD, laying in bed  HEENT [place]/AT EOMI hearing intact OM moist  Neck supple  Pulm/lungs  normal effort  CVS/Heart Regular, no rub  Abdomen:  Soft, non distended, PEG in place  Extremities: 1+ b/l LE edema  Neurologic: Alert, non focal  Skin: No acute rashes  Access: Rt IJ permcath       Basic Metabolic Panel:  Recent Labs Lab 09/22/14 0608 09/25/14 0528 09/27/14 0507  NA 135 134* 137  K 4.6 4.6 4.3  CL 97* 94* 97*  CO2 31 30 34*  GLUCOSE 118* 108* 96  BUN 83* 104* 78*  CREATININE 3.64* 3.93* 2.96*  CALCIUM 8.6* 9.1 8.5*  MG 2.4  --   --   PHOS 5.6* 5.8* 3.3     CBC:  Recent Labs Lab 09/22/14 0608 09/25/14 0528 09/27/14 0507  WBC 10.3 8.1 8.3  HGB 8.5* 8.5* 7.8*  HCT 28.3* 28.0* 26.0*  MCV 99.6 98.2 101.2*  PLT 237 221 199      Microbiology: No results found for this or any previous visit.  Coagulation Studies: No results for input(s): LABPROT, INR in the last 72 hours.  Urinalysis: No results for input(s): COLORURINE, LABSPEC, PHURINE, GLUCOSEU, HGBUR, BILIRUBINUR, KETONESUR, PROTEINUR, UROBILINOGEN, NITRITE, LEUKOCYTESUR in the last 72 hours.  Invalid input(s): APPERANCEUR    Imaging: Dg Chest Port 1 View  09/27/2014   CLINICAL DATA:  Respiratory failure  EXAM: PORTABLE CHEST - 1 VIEW  COMPARISON:  09/24/2014  FINDINGS: Left lower lobe consolidation unchanged most consistent with atelectasis and effusion. Mild right lower lobe airspace opacity also unchanged. Small right effusion.  Mild vascular congestion may reflect mild fluid overload. Negative for edema. Right jugular central venous catheter tips in the SVC and right atrium unchanged.  IMPRESSION: Mild vascular congestion may  reflect mild fluid overload. Negative for edema  Bibasilar consolidation and small pleural effusions unchanged.   Electronically Signed   By: Franchot Gallo M.D.   On: 09/27/2014 07:27     Medications:   Prednisone    Assessment/ Plan:  81 y.o. 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   1. Acute renal failure presumed secondary to glomerulonephritis syndrome with immune complex deposition in the setting of type III hypersensitivity reaction [per [facility]]. (no biopsy). Likely progressed to ESRD as she remains dialysis dependent 2. Anemia unspecified. 3. Secondary hyperparathyroidism.  Plan: Continue dialysis Decrease prednisone Continue aranesp Phos acceptable  .  LOS:  Murlean Iba 7/6/20163:56 PM

## 2014-09-28 LAB — CBC
HCT: 27.6 % — ABNORMAL LOW (ref 36.0–46.0)
HEMOGLOBIN: 8.1 g/dL — AB (ref 12.0–15.0)
MCH: 30 pg (ref 26.0–34.0)
MCHC: 29.3 g/dL — ABNORMAL LOW (ref 30.0–36.0)
MCV: 102.2 fL — ABNORMAL HIGH (ref 78.0–100.0)
Platelets: 194 10*3/uL (ref 150–400)
RBC: 2.7 MIL/uL — AB (ref 3.87–5.11)
RDW: 16.2 % — ABNORMAL HIGH (ref 11.5–15.5)
WBC: 10.9 10*3/uL — AB (ref 4.0–10.5)

## 2014-09-29 LAB — URINALYSIS W MICROSCOPIC (NOT AT ARMC)
BILIRUBIN URINE: NEGATIVE
GLUCOSE, UA: NEGATIVE mg/dL
HGB URINE DIPSTICK: NEGATIVE
KETONES UR: NEGATIVE mg/dL
Leukocytes, UA: NEGATIVE
Nitrite: NEGATIVE
PROTEIN: 30 mg/dL — AB
Specific Gravity, Urine: 1.012 (ref 1.005–1.030)
UROBILINOGEN UA: 0.2 mg/dL (ref 0.0–1.0)
pH: 5.5 (ref 5.0–8.0)

## 2014-09-29 LAB — BASIC METABOLIC PANEL
Anion gap: 4 — ABNORMAL LOW (ref 5–15)
BUN: 61 mg/dL — AB (ref 6–20)
CO2: 35 mmol/L — AB (ref 22–32)
CREATININE: 2.42 mg/dL — AB (ref 0.44–1.00)
Calcium: 8.2 mg/dL — ABNORMAL LOW (ref 8.9–10.3)
Chloride: 96 mmol/L — ABNORMAL LOW (ref 101–111)
GFR calc Af Amer: 21 mL/min — ABNORMAL LOW (ref >60)
GFR calc non Af Amer: 18 mL/min — ABNORMAL LOW (ref >60)
GLUCOSE: 103 mg/dL — AB (ref 65–99)
Potassium: 4.1 mmol/L (ref 3.5–5.1)
Sodium: 135 mmol/L (ref 135–145)

## 2014-09-29 LAB — CBC
HEMATOCRIT: 25.2 % — AB (ref 36.0–46.0)
Hemoglobin: 7.5 g/dL — ABNORMAL LOW (ref 12.0–15.0)
MCH: 30.5 pg (ref 26.0–34.0)
MCHC: 29.8 g/dL — AB (ref 30.0–36.0)
MCV: 102.4 fL — ABNORMAL HIGH (ref 78.0–100.0)
Platelets: 183 10*3/uL (ref 150–400)
RBC: 2.46 MIL/uL — ABNORMAL LOW (ref 3.87–5.11)
RDW: 16.1 % — ABNORMAL HIGH (ref 11.5–15.5)
WBC: 12.5 10*3/uL — ABNORMAL HIGH (ref 4.0–10.5)

## 2014-09-29 LAB — TRANSFERRIN: TRANSFERRIN: 151 mg/dL — AB (ref 192–382)

## 2014-09-29 LAB — PHOSPHORUS: PHOSPHORUS: 2.5 mg/dL (ref 2.5–4.6)

## 2014-09-29 LAB — IRON AND TIBC
Iron: 68 ug/dL (ref 28–170)
Saturation Ratios: 32 % — ABNORMAL HIGH (ref 10.4–31.8)
TIBC: 211 ug/dL — AB (ref 250–450)
UIBC: 143 ug/dL

## 2014-09-29 LAB — FERRITIN: Ferritin: 229 ng/mL (ref 11–307)

## 2014-09-29 NOTE — Progress Notes (Signed)
  Subjective:  Patient seen during dialysis Tolerating well  Tube feeds at 40 cc/hr + cough ? Aspiration bfr 400/dfr 800  Objective:  Vital signs in last 24 hours:  123/74  87  86  97.8       Weight change:  There were no vitals filed for this visit.  Intake/Output: UOP  600 cc   Physical Exam: General: NAD, laying in bed  HEENT /AT EOMI hearing intact OM moist  Neck supple  Pulm/lungs  normal effort  CVS/Heart Regular, no rub  Abdomen:  Soft, non distended, PEG in place  Extremities: 1+ b/l LE edema  Neurologic: Alert, non focal  Skin: No acute rashes  Access: Rt IJ permcath   foley    Basic Metabolic Panel:  Recent Labs Lab 09/25/14 0528 09/27/14 0507 09/29/14 0625  NA 134* 137 135  K 4.6 4.3 4.1  CL 94* 97* 96*  CO2 30 34* 35*  GLUCOSE 108* 96 103*  BUN 104* 78* 61*  CREATININE 3.93* 2.96* 2.42*  CALCIUM 9.1 8.5* 8.2*  PHOS 5.8* 3.3 2.5     CBC:  Recent Labs Lab 09/25/14 0528 09/27/14 0507 09/28/14 0542 09/29/14 0625  WBC 8.1 8.3 10.9* 12.5*  HGB 8.5* 7.8* 8.1* 7.5*  HCT 28.0* 26.0* 27.6* 25.2*  MCV 98.2 101.2* 102.2* 102.4*  PLT 221 199 194 183      Microbiology: No results found for this or any previous visit.  Coagulation Studies: No results for input(s): LABPROT, INR in the last 72 hours.  Urinalysis: No results for input(s): COLORURINE, LABSPEC, PHURINE, GLUCOSEU, HGBUR, BILIRUBINUR, KETONESUR, PROTEINUR, UROBILINOGEN, NITRITE, LEUKOCYTESUR in the last 72 hours.  Invalid input(s): APPERANCEUR    Imaging: No results found.   Medications:   Prednisone 15 mg daily aranesp 100 q week   Assessment/ Plan:  81 y.o. female with medical problems of multiple myeloma status post autologous stem cell transplantation, recent acute renal failure secondary to acute glomerulonephritis syndrome with immune complex deposition in the setting of type III hypersensitivity reaction, recent acute respiratory failure, was [facility] on 09/10/2014 for ongoing management.   1. Acute renal failure presumed secondary to glomerulonephritis syndrome with immune complex deposition in the setting of type III hypersensitivity reaction [per [facility]]. (no biopsy). Likely progressed to ESRD as she remains dialysis dependent 2. Anemia of CKD t-sat 2% 3. Secondary hyperparathyroidism.  Plan: Continue dialysis Decrease prednisone progressively Continue aranesp Phos acceptable   .  LOS:  Murlean Iba 7/8/20164:14 PM

## 2014-09-30 LAB — CBC WITH DIFFERENTIAL/PLATELET
BASOS PCT: 0 % (ref 0–1)
Basophils Absolute: 0 10*3/uL (ref 0.0–0.1)
Eosinophils Absolute: 0.1 10*3/uL (ref 0.0–0.7)
Eosinophils Relative: 1 % (ref 0–5)
HEMATOCRIT: 24.9 % — AB (ref 36.0–46.0)
Hemoglobin: 7.4 g/dL — ABNORMAL LOW (ref 12.0–15.0)
Lymphocytes Relative: 7 % — ABNORMAL LOW (ref 12–46)
Lymphs Abs: 0.9 10*3/uL (ref 0.7–4.0)
MCH: 30.1 pg (ref 26.0–34.0)
MCHC: 29.7 g/dL — AB (ref 30.0–36.0)
MCV: 101.2 fL — AB (ref 78.0–100.0)
MONO ABS: 1.5 10*3/uL — AB (ref 0.1–1.0)
Monocytes Relative: 12 % (ref 3–12)
Neutro Abs: 10.5 10*3/uL — ABNORMAL HIGH (ref 1.7–7.7)
Neutrophils Relative %: 80 % — ABNORMAL HIGH (ref 43–77)
Platelets: 166 10*3/uL (ref 150–400)
RBC: 2.46 MIL/uL — ABNORMAL LOW (ref 3.87–5.11)
RDW: 15.8 % — AB (ref 11.5–15.5)
WBC: 12.9 10*3/uL — ABNORMAL HIGH (ref 4.0–10.5)

## 2014-09-30 LAB — PROCALCITONIN

## 2014-09-30 LAB — BRAIN NATRIURETIC PEPTIDE: B Natriuretic Peptide: 720.2 pg/mL — ABNORMAL HIGH (ref 0.0–100.0)

## 2014-10-01 ENCOUNTER — Other Ambulatory Visit (HOSPITAL_COMMUNITY): Payer: Self-pay

## 2014-10-01 LAB — CBC
HEMATOCRIT: 24.5 % — AB (ref 36.0–46.0)
Hemoglobin: 7.3 g/dL — ABNORMAL LOW (ref 12.0–15.0)
MCH: 30.5 pg (ref 26.0–34.0)
MCHC: 29.8 g/dL — ABNORMAL LOW (ref 30.0–36.0)
MCV: 102.5 fL — ABNORMAL HIGH (ref 78.0–100.0)
Platelets: 167 10*3/uL (ref 150–400)
RBC: 2.39 MIL/uL — ABNORMAL LOW (ref 3.87–5.11)
RDW: 15.9 % — ABNORMAL HIGH (ref 11.5–15.5)
WBC: 12.8 10*3/uL — AB (ref 4.0–10.5)

## 2014-10-01 LAB — RENAL FUNCTION PANEL
ANION GAP: 6 (ref 5–15)
Albumin: 2.5 g/dL — ABNORMAL LOW (ref 3.5–5.0)
BUN: 44 mg/dL — ABNORMAL HIGH (ref 6–20)
CO2: 31 mmol/L (ref 22–32)
Calcium: 8.1 mg/dL — ABNORMAL LOW (ref 8.9–10.3)
Chloride: 98 mmol/L — ABNORMAL LOW (ref 101–111)
Creatinine, Ser: 2.01 mg/dL — ABNORMAL HIGH (ref 0.44–1.00)
GFR, EST AFRICAN AMERICAN: 26 mL/min — AB (ref >60)
GFR, EST NON AFRICAN AMERICAN: 22 mL/min — AB (ref >60)
GLUCOSE: 104 mg/dL — AB (ref 65–99)
PHOSPHORUS: 2.2 mg/dL — AB (ref 2.5–4.6)
Potassium: 3.5 mmol/L (ref 3.5–5.1)
SODIUM: 135 mmol/L (ref 135–145)

## 2014-10-01 LAB — URINE CULTURE: Culture: NO GROWTH

## 2014-10-01 LAB — TYPE AND SCREEN
ABO/RH(D): O POS
Antibody Screen: NEGATIVE

## 2014-10-02 LAB — RENAL FUNCTION PANEL
Albumin: 2.6 g/dL — ABNORMAL LOW (ref 3.5–5.0)
Anion gap: 7 (ref 5–15)
BUN: 65 mg/dL — ABNORMAL HIGH (ref 6–20)
CO2: 33 mmol/L — ABNORMAL HIGH (ref 22–32)
Calcium: 8.5 mg/dL — ABNORMAL LOW (ref 8.9–10.3)
Chloride: 95 mmol/L — ABNORMAL LOW (ref 101–111)
Creatinine, Ser: 2.44 mg/dL — ABNORMAL HIGH (ref 0.44–1.00)
GFR, EST AFRICAN AMERICAN: 21 mL/min — AB (ref >60)
GFR, EST NON AFRICAN AMERICAN: 18 mL/min — AB (ref >60)
GLUCOSE: 113 mg/dL — AB (ref 65–99)
POTASSIUM: 3.4 mmol/L — AB (ref 3.5–5.1)
Phosphorus: 2.5 mg/dL (ref 2.5–4.6)
SODIUM: 135 mmol/L (ref 135–145)

## 2014-10-02 LAB — CBC WITH DIFFERENTIAL/PLATELET
BASOS PCT: 1 % (ref 0–1)
Basophils Absolute: 0.1 10*3/uL (ref 0.0–0.1)
EOS ABS: 0.2 10*3/uL (ref 0.0–0.7)
Eosinophils Relative: 1 % (ref 0–5)
HCT: 26.7 % — ABNORMAL LOW (ref 36.0–46.0)
HEMOGLOBIN: 8.1 g/dL — AB (ref 12.0–15.0)
LYMPHS ABS: 0.9 10*3/uL (ref 0.7–4.0)
LYMPHS PCT: 7 % — AB (ref 12–46)
MCH: 30.7 pg (ref 26.0–34.0)
MCHC: 30.3 g/dL (ref 30.0–36.0)
MCV: 101.1 fL — AB (ref 78.0–100.0)
MONO ABS: 1 10*3/uL (ref 0.1–1.0)
Monocytes Relative: 8 % (ref 3–12)
Neutro Abs: 9.7 10*3/uL — ABNORMAL HIGH (ref 1.7–7.7)
Neutrophils Relative %: 83 % — ABNORMAL HIGH (ref 43–77)
Platelets: 189 10*3/uL (ref 150–400)
RBC: 2.64 MIL/uL — AB (ref 3.87–5.11)
RDW: 16.1 % — AB (ref 11.5–15.5)
WBC: 11.8 10*3/uL — ABNORMAL HIGH (ref 4.0–10.5)

## 2014-10-02 LAB — ABO/RH: ABO/RH(D): O POS

## 2014-10-02 NOTE — Progress Notes (Signed)
Subjective:  Patient seen immediately post HD. Tolerated well. UF achieved was 0.5kg.    Objective:  Vital signs in last 24 hours:  97.9 82 20 132/72     Weight change:  There were no vitals filed for this visit.   Physical Exam: General: NAD, laying in bed  HEENT [place]/AT EOMI hearing intact OM moist  Neck supple  Pulm/lungs normal effort CTAB   CVS/Heart Regular, no rub  Abdomen:  Soft, non distended, PEG in place  Extremities: 1+ b/l LE edema  Neurologic: Alert, awake, follows comamnds  Skin: No acute rashes  Access: Rt IJ permcath   foley    Basic Metabolic Panel:  Recent Labs Lab 09/27/14 0507 09/29/14 0625 10/01/14 0438 10/02/14 0541  NA 137 135 135 135  K 4.3 4.1 3.5 3.4*  CL 97* 96* 98* 95*  CO2 34* 35* 31 33*  GLUCOSE 96 103* 104* 113*  BUN 78* 61* 44* 65*  CREATININE 2.96* 2.42* 2.01* 2.44*  CALCIUM 8.5* 8.2* 8.1* 8.5*  PHOS 3.3 2.5 2.2* 2.5     CBC:  Recent Labs Lab 09/28/14 0542 09/29/14 0625 09/30/14 0326 10/01/14 0438 10/02/14 0541  WBC 10.9* 12.5* 12.9* 12.8* 11.8*  NEUTROABS  --   --  10.5*  --  9.7*  HGB 8.1* 7.5* 7.4* 7.3* 8.1*  HCT 27.6* 25.2* 24.9* 24.5* 26.7*  MCV 102.2* 102.4* 101.2* 102.5* 101.1*  PLT 194 183 166 167 189      Microbiology: Results for orders placed or performed during the hospital encounter of 09/20/2014  Culture, Urine     Status: None   Collection Time: 09/29/14  4:30 PM  Result Value Ref Range Status   Specimen Description URINE, RANDOM  Final   Special Requests NONE  Final   Culture NO GROWTH 2 DAYS  Final   Report Status 10/01/2014 FINAL  Final    Coagulation Studies: No results for input(s): LABPROT, INR in the last 72 hours.  Urinalysis: No results for input(s): COLORURINE, LABSPEC, PHURINE, GLUCOSEU, HGBUR, BILIRUBINUR, KETONESUR, PROTEINUR, UROBILINOGEN, NITRITE, LEUKOCYTESUR in the last 72 hours.  Invalid input(s): APPERANCEUR    Imaging: Dg Chest Port 1 View  10/01/2014   CLINICAL  DATA:  Fluid overload  EXAM: PORTABLE CHEST - 1 VIEW  COMPARISON:  09/27/2014 and prior radiographs  FINDINGS: Cardiomegaly again noted.  A right central venous catheter with tips overlying the superior cavoatrial junction again noted.  White vascular congestion is identified.  Small bilateral pleural effusions and left lower lung consolidation/ atelectasis again noted.  There is no evidence of pneumothorax.  Vertebral augmentation changes of multiple thoracic vertebra again noted.  IMPRESSION: Little interval change with small bilateral pleural effusions, left lower lung consolidation/atelectasis and pulmonary vascular congestion.   Electronically Signed   By: Margarette Canada M.D.   On: 10/01/2014 09:51     Medications:   Prednisone 15 mg daily aranesp 100 q week   Assessment/ Plan:  81 y.o. female with medical problems of multiple myeloma status post autologous stem cell transplantation, recent acute renal failure secondary to acute glomerulonephritis syndrome with immune complex deposition in the setting of type III hypersensitivity reaction, recent acute respiratory failure, was [facility] on 08/23/2014 for ongoing management.   1. Acute renal failure presumed secondary to glomerulonephritis syndrome with immune complex deposition in the setting of type III hypersensitivity reaction [per [facility]]. (no biopsy). Likely progressed to ESRD as she remains dialysis dependent 2. Anemia of CKD t-sat 2% 3.  Secondary hyperparathyroidism.  Plan: Pt completed HD today. Tolerated well, UF achieved was 0.5kg.  Will plan for HD again on Wednesday.  Hgb currently up to 8.1. Will continue arnaesp 166mg [place] Q weekly.  Phos was 2.5 and is acceptable.    Bailey Calderon 7/11/20166:08 PM

## 2014-10-03 LAB — PARATHYROID HORMONE, INTACT (NO CA): PTH: 50 pg/mL (ref 15–65)

## 2014-10-04 LAB — RENAL FUNCTION PANEL
ALBUMIN: 2.6 g/dL — AB (ref 3.5–5.0)
Anion gap: 5 (ref 5–15)
BUN: 44 mg/dL — AB (ref 6–20)
CHLORIDE: 96 mmol/L — AB (ref 101–111)
CO2: 35 mmol/L — ABNORMAL HIGH (ref 22–32)
CREATININE: 2.12 mg/dL — AB (ref 0.44–1.00)
Calcium: 8.5 mg/dL — ABNORMAL LOW (ref 8.9–10.3)
GFR calc non Af Amer: 21 mL/min — ABNORMAL LOW (ref >60)
GFR, EST AFRICAN AMERICAN: 24 mL/min — AB (ref >60)
Glucose, Bld: 129 mg/dL — ABNORMAL HIGH (ref 65–99)
POTASSIUM: 3.5 mmol/L (ref 3.5–5.1)
Phosphorus: 2.9 mg/dL (ref 2.5–4.6)
SODIUM: 136 mmol/L (ref 135–145)

## 2014-10-04 LAB — EXPECTORATED SPUTUM ASSESSMENT W REFEX TO RESP CULTURE

## 2014-10-04 LAB — CBC
HCT: 27.4 % — ABNORMAL LOW (ref 36.0–46.0)
Hemoglobin: 8 g/dL — ABNORMAL LOW (ref 12.0–15.0)
MCH: 30.3 pg (ref 26.0–34.0)
MCHC: 29.2 g/dL — ABNORMAL LOW (ref 30.0–36.0)
MCV: 103.8 fL — AB (ref 78.0–100.0)
Platelets: 219 10*3/uL (ref 150–400)
RBC: 2.64 MIL/uL — ABNORMAL LOW (ref 3.87–5.11)
RDW: 16.6 % — AB (ref 11.5–15.5)
WBC: 11.8 10*3/uL — AB (ref 4.0–10.5)

## 2014-10-04 NOTE — Progress Notes (Signed)
  Subjective:  Seen on hemodialysis treatment. Tolerating treatment well. UF goal of 1.5 litres. Patient states she is without complaints.    Objective:  Vital signs in last 24 hours:  T 97.4 p 84 rr 15 bp 12258 SpO2 94%     Weight change:  There were no vitals filed for this visit.   Physical Exam: General: NAD, laying in bed  HEENT [place]/AT EOMI hearing intact OM moist  Neck supple  Pulm/lungs Diminished at bases bilaterally  CVS/Heart Regular, no rub  Abdomen:  Soft, non distended, +PEG  Extremities: Trace dependent edema  Neurologic: Alert, awake, follows comamnds  Skin: No acute rashes  Access: Rt IJ permcath   foley    Basic Metabolic Panel:  Recent Labs Lab 09/29/14 0625 10/01/14 0438 10/02/14 0541 10/04/14 0426  NA 135 135 135 136  K 4.1 3.5 3.4* 3.5  CL 96* 98* 95* 96*  CO2 35* 31 33* 35*  GLUCOSE 103* 104* 113* 129*  BUN 61* 44* 65* 44*  CREATININE 2.42* 2.01* 2.44* 2.12*  CALCIUM 8.2* 8.1* 8.5* 8.5*  PHOS 2.5 2.2* 2.5 2.9     CBC:  Recent Labs Lab 09/29/14 0625 09/30/14 0326 10/01/14 0438 10/02/14 0541 10/04/14 0426  WBC 12.5* 12.9* 12.8* 11.8* 11.8*  NEUTROABS  --  10.5*  --  9.7*  --   HGB 7.5* 7.4* 7.3* 8.1* 8.0*  HCT 25.2* 24.9* 24.5* 26.7* 27.4*  MCV 102.4* 101.2* 102.5* 101.1* 103.8*  PLT 183 166 167 189 219      Microbiology: Results for orders placed or performed during the hospital encounter of 08/31/2014  Culture, Urine     Status: None   Collection Time: 09/29/14  4:30 PM  Result Value Ref Range Status   Specimen Description URINE, RANDOM  Final   Special Requests NONE  Final   Culture NO GROWTH 2 DAYS  Final   Report Status 10/01/2014 FINAL  Final    Coagulation Studies: No results for input(s): LABPROT, INR in the last 72 hours.  Urinalysis: No results for input(s): COLORURINE, LABSPEC, PHURINE, GLUCOSEU, HGBUR, BILIRUBINUR, KETONESUR, PROTEINUR, UROBILINOGEN, NITRITE, LEUKOCYTESUR in the last 72 hours.  Invalid  input(s): APPERANCEUR    Imaging: No results found.   Medications:   Prednisone 15 mg daily aranesp 100 q week   Assessment/ Plan:  81 y.o. female with medical problems of multiple myeloma status post autologous stem cell transplantation, recent acute renal failure secondary to acute glomerulonephritis syndrome with immune complex deposition in the setting of type III hypersensitivity reaction, recent acute respiratory failure, was [facility] on 09/10/2014 for ongoing management.   1. Acute renal failure presumed secondary to glomerulonephritis syndrome with immune complex deposition in the setting of type III hypersensitivity reaction [per [facility]]. (no biopsy). Likely progressed to ESRD as she remains dialysis dependent. - Tolerating treatment well. Plan for next treatment on Friday, continue MWF schedule.  - Will have minimal UF to help with renal recovery.   2. Anemia of CKD: hemoglobin of 8, with iron deficiency.  - Aranesp 100 weekly.  - holding IV iron due to active infection.   3. Secondary hyperparathyroidism: PTH 50. Calcium 8.5, phosphorus 2.9.  - not currently on any binders.   4. Hypertension: some low blood pressures. Current regimen of furosemide and metoprolol.    [place], Lurena Nida 7/13/20164:20 PM

## 2014-10-05 ENCOUNTER — Other Ambulatory Visit (HOSPITAL_COMMUNITY): Payer: Self-pay

## 2014-10-06 LAB — RENAL FUNCTION PANEL
ALBUMIN: 2.6 g/dL — AB (ref 3.5–5.0)
ANION GAP: 9 (ref 5–15)
BUN: 58 mg/dL — ABNORMAL HIGH (ref 6–20)
CO2: 28 mmol/L (ref 22–32)
Calcium: 8.3 mg/dL — ABNORMAL LOW (ref 8.9–10.3)
Chloride: 98 mmol/L — ABNORMAL LOW (ref 101–111)
Creatinine, Ser: 2.18 mg/dL — ABNORMAL HIGH (ref 0.44–1.00)
GFR calc non Af Amer: 20 mL/min — ABNORMAL LOW (ref >60)
GFR, EST AFRICAN AMERICAN: 24 mL/min — AB (ref >60)
Glucose, Bld: 115 mg/dL — ABNORMAL HIGH (ref 65–99)
PHOSPHORUS: 1.2 mg/dL — AB (ref 2.5–4.6)
POTASSIUM: 3.3 mmol/L — AB (ref 3.5–5.1)
SODIUM: 135 mmol/L (ref 135–145)

## 2014-10-06 LAB — CBC
HEMATOCRIT: 25 % — AB (ref 36.0–46.0)
Hemoglobin: 7.5 g/dL — ABNORMAL LOW (ref 12.0–15.0)
MCH: 30.9 pg (ref 26.0–34.0)
MCHC: 30 g/dL (ref 30.0–36.0)
MCV: 102.9 fL — AB (ref 78.0–100.0)
PLATELETS: 193 10*3/uL (ref 150–400)
RBC: 2.43 MIL/uL — ABNORMAL LOW (ref 3.87–5.11)
RDW: 17.1 % — ABNORMAL HIGH (ref 11.5–15.5)
WBC: 13.3 10*3/uL — ABNORMAL HIGH (ref 4.0–10.5)

## 2014-10-06 LAB — EXPECTORATED SPUTUM ASSESSMENT W REFEX TO RESP CULTURE

## 2014-10-06 LAB — EXPECTORATED SPUTUM ASSESSMENT W GRAM STAIN, RFLX TO RESP C

## 2014-10-06 NOTE — Progress Notes (Signed)
Subjective:  Pt due for HD today. States she has some nausea this AM.  Emesis basin at bedside.    Objective:  Vital signs in last 24 hours:  T 96.3 p 80 rr 18 bp 153/78     Physical Exam: General: NAD, laying in bed  HEENT [place]/AT EOMI hearing intact OM moist  Neck supple  Pulm/lungs CTAB normal effort  CVS/Heart Regular, no rub  Abdomen:  Soft, non distended, +PEG  Extremities: Trace dependent edema  Neurologic: Alert, awake, follows comamnds  Skin: No acute rashes  Access: Rt IJ permcath   foley    Basic Metabolic Panel:  Recent Labs Lab 10/01/14 0438 10/02/14 0541 10/04/14 0426 10/06/14 0421  NA 135 135 136 135  K 3.5 3.4* 3.5 3.3*  CL 98* 95* 96* 98*  CO2 31 33* 35* 28  GLUCOSE 104* 113* 129* 115*  BUN 44* 65* 44* 58*  CREATININE 2.01* 2.44* 2.12* 2.18*  CALCIUM 8.1* 8.5* 8.5* 8.3*  PHOS 2.2* 2.5 2.9 1.2*     CBC:  Recent Labs Lab 09/30/14 0326 10/01/14 0438 10/02/14 0541 10/04/14 0426  WBC 12.9* 12.8* 11.8* 11.8*  NEUTROABS 10.5*  --  9.7*  --   HGB 7.4* 7.3* 8.1* 8.0*  HCT 24.9* 24.5* 26.7* 27.4*  MCV 101.2* 102.5* 101.1* 103.8*  PLT 166 167 189 219      Microbiology: Results for orders placed or performed during the hospital encounter of 2014/10/04  Culture, Urine     Status: None   Collection Time: 09/29/14  4:30 PM  Result Value Ref Range Status   Specimen Description URINE, RANDOM  Final   Special Requests NONE  Final   Culture NO GROWTH 2 DAYS  Final   Report Status 10/01/2014 FINAL  Final  Culture, expectorated sputum-assessment     Status: None   Collection Time: 10/04/14  6:40 PM  Result Value Ref Range Status   Specimen Description SPUTUM  Final   Special Requests NONE  Final   Sputum evaluation   Final    MICROSCOPIC FINDINGS SUGGEST THAT THIS SPECIMEN IS NOT REPRESENTATIVE OF LOWER RESPIRATORY SECRETIONS. PLEASE RECOLLECT. Dema Severin RN 2015 10/04/14 [name]    Report Status 10/04/2014 FINAL  Final  Culture,  expectorated sputum-assessment     Status: None   Collection Time: 10/06/14  3:07 AM  Result Value Ref Range Status   Specimen Description SPUTUM  Final   Special Requests NONE  Final   Sputum evaluation   Final    THIS SPECIMEN IS ACCEPTABLE. RESPIRATORY CULTURE REPORT TO FOLLOW.   Report Status 10/06/2014 FINAL  Final    Coagulation Studies: No results for input(s): LABPROT, INR in the last 72 hours.  Urinalysis: No results for input(s): COLORURINE, LABSPEC, PHURINE, GLUCOSEU, HGBUR, BILIRUBINUR, KETONESUR, PROTEINUR, UROBILINOGEN, NITRITE, LEUKOCYTESUR in the last 72 hours.  Invalid input(s): APPERANCEUR    Imaging: Ct Chest Wo Contrast  10/05/2014   CLINICAL DATA:  Pneumonia versus pleural effusion. Recent acute respiratory distress.  EXAM: CT CHEST WITHOUT CONTRAST  TECHNIQUE: Multidetector CT imaging of the chest was performed following the standard protocol without IV contrast.  COMPARISON:  Chest radiographs, 10/05/2014 and 10/01/2014.  FINDINGS: Thoracic inlet: Mild enlargement of the thyroid gland. Mildly hypo attenuating 15 mm posterior left lobe nodule. No neck base masses or pathologically enlarged lymph nodes. True lumen right internal jugular central venous line. Distal tip projects just within the right atrium.  Mediastinum and hila: Moderate cardiac enlargement. Mild coronary artery  calcifications. Mild enlargement of the pulmonary arteries, right measuring 3 cm and left measuring 2.9 cm. No mediastinal or hilar masses or discrete enlarged lymph nodes.  Lungs and pleura: Small left pleural effusion. Near complete atelectasis of the left lower lobe. There could be a component of pneumonia. Dependent subsegmental atelectasis noted in the right lower lobe with mild subsegmental atelectasis noted in the dependent upper lobes and right middle lobe. No pulmonary edema. Minor scarring at the apices. No lung mass or suspicious nodule.  Limited upper abdomen: No acute finding.  Calcification noted along a normal caliber upper abdominal aorta.  Chest wall: Oval density within the right pectoralis major is likely an implant. This is not seen on the left.  Musculoskeletal: Multiple compression fractures of the thoracic spine, which have been treated with vertebroplasty. Fracture of the sternal body, old, with a concave deformity. No osteoblastic or osteolytic lesions.  IMPRESSION: 1. Near complete atelectasis of the left lower lobe. There could be a component of pneumonia, but this is most likely all atelectasis. 2. Small left pleural effusion. 3. Mild dependent subsegmental atelectasis in the right lower lobe and, to lesser degree, upper lobes and right middle lobe. No pulmonary edema. 4. Moderate cardiomegaly. 5. Mild enlargement of the pulmonary arteries. Consider pulmonary hypertension in the proper clinical setting. 6. Mild enlargement of thyroid. Apparent 15 mm left lobe nodule. Consider elective thyroid ultrasound if this has not been previously performed. 7. Numerous treated compression fractures of the thoracic spine.   Electronically Signed   By: Amie Portlandavid  Ormond M.D.   On: 10/05/2014 17:28   Dg Chest Port 1 View  10/05/2014   CLINICAL DATA:  Cough, shortness of breath.  EXAM: PORTABLE CHEST - 1 VIEW  COMPARISON:  October 01, 2014.  FINDINGS: Stable cardiomegaly. Right internal jugular dialysis catheter is unchanged in position. No pneumothorax is noted. Mild right basilar subsegmental atelectasis is noted. Stable left basilar opacity is noted concerning for pneumonia or atelectasis with associated pleural effusion.  IMPRESSION: Mild right basilar subsegmental atelectasis. Stable left basilar opacity concerning for pneumonia or atelectasis with associated pleural effusion.   Electronically Signed   By: Lupita RaiderJames  Green Jr, M.D.   On: 10/05/2014 10:55     Medications:   Prednisone 15 mg daily aranesp 100 q week   Assessment/ Plan:  81 y.o. 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   1. Acute renal failure presumed secondary to glomerulonephritis syndrome with immune complex deposition in the setting of type III hypersensitivity reaction [per [facility]]. (no biopsy). Likely progressed to ESRD as she remains dialysis dependent. - Pt seen at bedside this AM, due for HD again today, orders prepared, will prepare orders for Monday as well.  2. Anemia of CKD: hemoglobin of 8, with iron deficiency.  - Hgb 8 at last check, continue aranesp.   3. Secondary hyperparathyroidism: PTH 50, phos 2.9 at last check. - continue to monitor bone mineral metabolism parameters.  4. Hypertension: BP fluctuating, continue metoprolol for now.   Bailey Calderon 7/15/20168:30 AM

## 2014-10-07 ENCOUNTER — Other Ambulatory Visit (HOSPITAL_COMMUNITY): Payer: Self-pay

## 2014-10-09 ENCOUNTER — Other Ambulatory Visit (HOSPITAL_COMMUNITY): Payer: Self-pay

## 2014-10-09 LAB — RENAL FUNCTION PANEL
Albumin: 2.4 g/dL — ABNORMAL LOW (ref 3.5–5.0)
Anion gap: 8 (ref 5–15)
BUN: 96 mg/dL — ABNORMAL HIGH (ref 6–20)
CALCIUM: 8.5 mg/dL — AB (ref 8.9–10.3)
CO2: 29 mmol/L (ref 22–32)
CREATININE: 3.44 mg/dL — AB (ref 0.44–1.00)
Chloride: 96 mmol/L — ABNORMAL LOW (ref 101–111)
GFR calc Af Amer: 14 mL/min — ABNORMAL LOW (ref >60)
GFR calc non Af Amer: 12 mL/min — ABNORMAL LOW (ref >60)
GLUCOSE: 116 mg/dL — AB (ref 65–99)
Phosphorus: 2.4 mg/dL — ABNORMAL LOW (ref 2.5–4.6)
Potassium: 3.7 mmol/L (ref 3.5–5.1)
Sodium: 133 mmol/L — ABNORMAL LOW (ref 135–145)

## 2014-10-09 LAB — CBC
HCT: 25.4 % — ABNORMAL LOW (ref 36.0–46.0)
Hemoglobin: 7.6 g/dL — ABNORMAL LOW (ref 12.0–15.0)
MCH: 30.6 pg (ref 26.0–34.0)
MCHC: 29.9 g/dL — ABNORMAL LOW (ref 30.0–36.0)
MCV: 102.4 fL — ABNORMAL HIGH (ref 78.0–100.0)
Platelets: 168 10*3/uL (ref 150–400)
RBC: 2.48 MIL/uL — ABNORMAL LOW (ref 3.87–5.11)
RDW: 18.4 % — ABNORMAL HIGH (ref 11.5–15.5)
WBC: 16.9 10*3/uL — ABNORMAL HIGH (ref 4.0–10.5)

## 2014-10-09 LAB — URINALYSIS, ROUTINE W REFLEX MICROSCOPIC
Bilirubin Urine: NEGATIVE
Glucose, UA: NEGATIVE mg/dL
HGB URINE DIPSTICK: NEGATIVE
KETONES UR: NEGATIVE mg/dL
Leukocytes, UA: NEGATIVE
Nitrite: NEGATIVE
PROTEIN: 30 mg/dL — AB
SPECIFIC GRAVITY, URINE: 1.014 (ref 1.005–1.030)
Urobilinogen, UA: 0.2 mg/dL (ref 0.0–1.0)
pH: 5 (ref 5.0–8.0)

## 2014-10-09 LAB — URINE MICROSCOPIC-ADD ON

## 2014-10-09 LAB — BRAIN NATRIURETIC PEPTIDE: B Natriuretic Peptide: 529.4 pg/mL — ABNORMAL HIGH (ref 0.0–100.0)

## 2014-10-09 LAB — T4, FREE: Free T4: 1.25 ng/dL — ABNORMAL HIGH (ref 0.61–1.12)

## 2014-10-09 LAB — TSH: TSH: 0.24 u[IU]/mL — AB (ref 0.350–4.500)

## 2014-10-09 NOTE — Progress Notes (Signed)
Subjective:  Patient seen during dialysis. bfr 400/ dfr 800 BP noted to be low.   trying to pull fluid per Primary team request. bp decreased to 90's   Objective:  Vital signs in last 24 hours:  T 98.7  95/43  84       Physical Exam: General: NAD, laying in bed  HEENT EOMI hearing intact OM moist  Neck supple  Pulm/lungs normal effort, clear ant/lat  CVS/Heart Regular, no rub  Abdomen:  Soft, non distended, +PEG  Extremities: Trace dependent edema  Neurologic: Alert, awake, follows comamnds  Skin: No acute rashes  Access: Rt IJ permcath   foley    Basic Metabolic Panel:  Recent Labs Lab 10/04/14 0426 10/06/14 0421 10/09/14 0604  NA 136 135 133*  K 3.5 3.3* 3.7  CL 96* 98* 96*  CO2 35* 28 29  GLUCOSE 129* 115* 116*  BUN 44* 58* 96*  CREATININE 2.12* 2.18* 3.44*  CALCIUM 8.5* 8.3* 8.5*  PHOS 2.9 1.2* 2.4*     CBC:  Recent Labs Lab 10/04/14 0426 10/06/14 0421 10/09/14 0604  WBC 11.8* 13.3* 16.9*  HGB 8.0* 7.5* 7.6*  HCT 27.4* 25.0* 25.4*  MCV 103.8* 102.9* 102.4*  PLT 219 193 168      Microbiology: Results for orders placed or performed during the hospital encounter of 09/15/2014  Culture, Urine     Status: None   Collection Time: 09/29/14  4:30 PM  Result Value Ref Range Status   Specimen Description URINE, RANDOM  Final   Special Requests NONE  Final   Culture NO GROWTH 2 DAYS  Final   Report Status 10/01/2014 FINAL  Final  Culture, expectorated sputum-assessment     Status: None   Collection Time: 10/04/14  6:40 PM  Result Value Ref Range Status   Specimen Description SPUTUM  Final   Special Requests NONE  Final   Sputum evaluation   Final    MICROSCOPIC FINDINGS SUGGEST THAT THIS SPECIMEN IS NOT REPRESENTATIVE OF LOWER RESPIRATORY SECRETIONS. PLEASE RECOLLECT. Juliane Poot RN 2015 10/04/14 [name]    Report Status 10/04/2014 FINAL  Final  Culture, expectorated sputum-assessment     Status: None   Collection Time: 10/06/14  3:07  AM  Result Value Ref Range Status   Specimen Description SPUTUM  Final   Special Requests NONE  Final   Sputum evaluation   Final    THIS SPECIMEN IS ACCEPTABLE. RESPIRATORY CULTURE REPORT TO FOLLOW.   Report Status 10/06/2014 FINAL  Final  Culture, respiratory (NON-Expectorated)     Status: None (Preliminary result)   Collection Time: 10/06/14  3:07 AM  Result Value Ref Range Status   Specimen Description SPUTUM  Final   Special Requests NONE  Final   Gram Stain   Final    MODERATE WBC PRESENT,BOTH PMN AND MONONUCLEAR RARE SQUAMOUS EPITHELIAL CELLS PRESENT FEW GRAM NEGATIVE RODS Performed at [facility]    Culture   Final    ABUNDANT PSEUDOMONAS AERUGINOSA Performed at [facility]    Report Status PENDING  Incomplete    Coagulation Studies: No results for input(s): LABPROT, INR in the last 72 hours.  Urinalysis:  Recent Labs  10/09/14 1215  COLORURINE YELLOW  LABSPEC 1.014  PHURINE 5.0  GLUCOSEU NEGATIVE  HGBUR NEGATIVE  BILIRUBINUR NEGATIVE  KETONESUR NEGATIVE  PROTEINUR 30*  UROBILINOGEN 0.2  NITRITE NEGATIVE  LEUKOCYTESUR NEGATIVE      Imaging: US Soft Tissue Head/neck  10/07/2014   CLINICAL DATA:  Thyroid nodule on recent CT examination  EXAM: THYROID ULTRASOUND  TECHNIQUE: Ultrasound examination of the thyroid gland and adjacent soft tissues was performed.  COMPARISON:  10/05/1958  FINDINGS: Right thyroid lobe  Measurements: 3.9 x 1.5 x 2.0 cm.  No nodules visualized.  Left thyroid lobe  Measurements: 4.6 x 2.5 x 2.3 cm. A 2.3 cm relatively hypoechoic nodule is noted in the lower pole of the left lobe of the thyroid corresponding with that seen on recent CT examination.  Isthmus  Thickness: 4 mm.  No nodules visualized.  Lymphadenopathy  None visualized.  IMPRESSION: Left thyroid nodule. Findings meet consensus criteria for biopsy. Ultrasound-guided fine needle aspiration should be considered, as per the consensus statement: Management of  Thyroid Nodules Detected at [place]: Society of Radiologists in [place]. Radiology 2005; [id number].   Electronically Signed   By: Inez Catalina M.D.   On: 10/07/2014 17:00   Dg Chest Port 1 View  10/09/2014   CLINICAL DATA:  Cough.  Respiratory failure.  EXAM: PORTABLE CHEST - 1 VIEW  COMPARISON:  Chest CT and chest radiograph, 10/05/2014.  FINDINGS: Lung base opacity has increased on the right when compared to the prior study, likely due to increased atelectasis and pleural fluid. There is persistent opacity obscuring the left hemidiaphragm left heart border reflecting pleural fluid with atelectasis. There is no pulmonary edema. No pneumothorax. Multiple left rib fractures are stable. Cardiac silhouette is mildly enlarged.  Right internal jugular dual-lumen tunneled central venous catheter is stable.  IMPRESSION: 1. Mild increased and opacity at the right lung base when compared the prior study, most likely combination pleural fluid and atelectasis. Pneumonia is not excluded. 2. No other change. Persistent left pleural effusion and left lung base atelectasis. No pulmonary edema. No pneumothorax.   Electronically Signed   By: Lajean Manes M.D.   On: 10/09/2014 11:44     Medications:   Prednisone 15 mg daily aranesp 100 q week   Assessment/ Plan:  81 y.o. 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   1.  Likely progressed to ESRD as she remains dialysis dependent. Acute renal failure presumed secondary to glomerulonephritis syndrome with immune complex deposition in the setting of type III hypersensitivity reaction [per [facility]]. (no biopsy). - Pt  seen at bedside during dialysis. Dialyzing in chair today.  - continues to have cough. - UF being attempted per primary team's request but patient's BP dropped to 80-90's. To be given iv albumin  2. Anemia of CKD:  with iron deficiency.  - Hgb 7.6 at last check, continue aranesp.   3. Secondary hyperparathyroidism: PTH 50, phos 2.4 at last check. - continue to monitor bone mineral metabolism parameters.  4. Hypertension: BP fluctuating, continue metoprolol for now.    Murlean Iba 7/18/20163:23 PM

## 2014-10-10 ENCOUNTER — Other Ambulatory Visit (HOSPITAL_BASED_OUTPATIENT_CLINIC_OR_DEPARTMENT_OTHER): Payer: Medicare Other

## 2014-10-10 DIAGNOSIS — I509 Heart failure, unspecified: Secondary | ICD-10-CM | POA: Diagnosis not present

## 2014-10-10 LAB — URINE CULTURE: Culture: NO GROWTH

## 2014-10-10 LAB — CULTURE, RESPIRATORY

## 2014-10-10 LAB — CULTURE, RESPIRATORY W GRAM STAIN

## 2014-10-10 NOTE — Progress Notes (Deleted)
Echocardiogram 2D Echocardiogram has been performed.  Nolon RodBrown, Tony 10/10/2014, 11:01 AM

## 2014-10-10 NOTE — Progress Notes (Signed)
Echocardiogram 2D Echocardiogram with Definity has been performed.  Nolon RodBrown, Tony 10/10/2014, 11:06 AM

## 2014-10-11 LAB — CBC
HEMATOCRIT: 22.4 % — AB (ref 36.0–46.0)
Hemoglobin: 6.6 g/dL — CL (ref 12.0–15.0)
MCH: 30.6 pg (ref 26.0–34.0)
MCHC: 29.9 g/dL — ABNORMAL LOW (ref 30.0–36.0)
MCV: 102.3 fL — AB (ref 78.0–100.0)
Platelets: 148 10*3/uL — ABNORMAL LOW (ref 150–400)
RBC: 2.19 MIL/uL — AB (ref 3.87–5.11)
RDW: 19.8 % — ABNORMAL HIGH (ref 11.5–15.5)
WBC: 15.2 10*3/uL — AB (ref 4.0–10.5)

## 2014-10-11 LAB — RENAL FUNCTION PANEL
Albumin: 2.3 g/dL — ABNORMAL LOW (ref 3.5–5.0)
Anion gap: 8 (ref 5–15)
BUN: 55 mg/dL — AB (ref 6–20)
CO2: 29 mmol/L (ref 22–32)
CREATININE: 2.36 mg/dL — AB (ref 0.44–1.00)
Calcium: 8.1 mg/dL — ABNORMAL LOW (ref 8.9–10.3)
Chloride: 100 mmol/L — ABNORMAL LOW (ref 101–111)
GFR calc Af Amer: 21 mL/min — ABNORMAL LOW (ref >60)
GFR, EST NON AFRICAN AMERICAN: 18 mL/min — AB (ref >60)
GLUCOSE: 100 mg/dL — AB (ref 65–99)
PHOSPHORUS: 1.8 mg/dL — AB (ref 2.5–4.6)
Potassium: 3.9 mmol/L (ref 3.5–5.1)
Sodium: 137 mmol/L (ref 135–145)

## 2014-10-11 LAB — PREPARE RBC (CROSSMATCH)

## 2014-10-11 NOTE — Progress Notes (Signed)
Subjective:  Patient seen during dialysis. bfr 400/ dfr 800 + cough productive of sputum Getting ready to receive blood transfusion during dialysis   Objective:  Vital signs in last 24 hours:  T 130/66 102 99.4      Physical Exam: General: NAD, laying in bed  HEENT EOMI hearing intact OM moist  Neck supple  Pulm/lungs normal effort, clear ant/lat  CVS/Heart Regular, no rub  Abdomen:  Soft, non distended, +PEG  Extremities: ++ dependent edema  Neurologic: Alert, awake, follows comamnds  Skin: No acute rashes  Access: Rt IJ permcath   foley    Basic Metabolic Panel:  Recent Labs Lab 10/06/14 0421 10/09/14 0604 10/11/14 0438  NA 135 133* 137  K 3.3* 3.7 3.9  CL 98* 96* 100*  CO2 _0 GLUCOSE 115* 116* 100*  BUN 58* 96* 55*  CREATININE 2.18* 3.44* 2.36*  CALCIUM 8.3* 8.5* 8.1*  PHOS 1.2* 2.4* 1.8*     CBC:  Recent Labs Lab 10/06/14 0421 10/09/14 0604 10/11/14 0438  WBC 13.3* 16.9* 15.2*  HGB 7.5* 7.6* 6.6*  HCT 25.0* 25.4* 22.4*  MCV 102.9* 102.4* 102.3*  PLT 193 168 148*      Microbiology: Results for orders placed or performed during the hospital encounter of 08/24/2014  Culture, Urine     Status: None   Collection Time: 09/29/14  4:30 PM  Result Value Ref Range Status   Specimen Description URINE, RANDOM  Final   Special Requests NONE  Final   Culture NO GROWTH 2 DAYS  Final   Report Status 10/01/2014 FINAL  Final  Culture, expectorated sputum-assessment     Status: None   Collection Time: 10/04/14  6:40 PM  Result Value Ref Range Status   Specimen Description SPUTUM  Final   Special Requests NONE  Final   Sputum evaluation   Final    MICROSCOPIC FINDINGS SUGGEST THAT THIS SPECIMEN IS NOT REPRESENTATIVE OF LOWER RESPIRATORY SECRETIONS. PLEASE RECOLLECT. Juliane Poot RN 2015 10/04/14 [name]    Report Status 10/04/2014 FINAL  Final  Culture, expectorated sputum-assessment     Status: None   Collection Time: 10/06/14  3:07 AM   Result Value Ref Range Status   Specimen Description SPUTUM  Final   Special Requests NONE  Final   Sputum evaluation   Final    THIS SPECIMEN IS ACCEPTABLE. RESPIRATORY CULTURE REPORT TO FOLLOW.   Report Status 10/06/2014 FINAL  Final  Culture, respiratory (NON-Expectorated)     Status: None   Collection Time: 10/06/14  3:07 AM  Result Value Ref Range Status   Specimen Description SPUTUM  Final   Special Requests NONE  Final   Gram Stain   Final    MODERATE WBC PRESENT,BOTH PMN AND MONONUCLEAR RARE SQUAMOUS EPITHELIAL CELLS PRESENT FEW GRAM NEGATIVE RODS    Culture   Final    ABUNDANT PSEUDOMONAS AERUGINOSA 19 Note: COLISTIN=3 uG/ML S ETEST results for this drug are "FOR INVESTIGATIONAL USE ONLY" and should NOT be used for clinical purposes. MULTIDRUG RESISTANT CRITICAL RESULT CALLED TO, READ BACK BY AND VERIFIED WITH: VANESSA DAVIS 7 16_1  BY PARDA    Report Status 10/10/2014 FINAL  Final   Organism ID, Bacteria PSEUDOMONAS AERUGINOSA  Final      Susceptibility   Pseudomonas aeruginosa - MIC*    CEFEPIME Value in next row Resistant      32 RESISTANTPerformed at [facility]    CEFTAZIDIME Value in next row Intermediate  16 INTERMEDIATEPerformed at [facility]    CIPROFLOXACIN Value in next row Resistant      >=4 RESISTANTPerformed at [place] Value in next row Resistant      >=16 RESISTANTPerformed at [facility]    IMIPENEM Value in next row Resistant      >=16 RESISTANTPerformed at [facility]    PIP/TAZO Value in next row Sensitive      32 SENSITIVEPerformed at [facility]    TICAR/CLAVULANIC Value in next row Resistant      >=128 RESISTANTPerformed at [facility]    TOBRAMYCIN Value in next row Resistant      >=16 RESISTANTPerformed at [facility]    AMIKACIN Value in next row Intermediate      32 INTERMEDIATEPerformed at [place]  Culture, Urine     Status: None   Collection Time: 10/09/14 12:15 PM  Result Value Ref Range Status   Specimen Description URINE, RANDOM  Final   Special Requests NONE  Final   Culture NO GROWTH 1 DAY  Final   Report Status 10/10/2014 FINAL  Final    Coagulation Studies: No results for input(s): LABPROT, INR in the last 72 hours.  Urinalysis:  Recent Labs  10/09/14 1215  COLORURINE YELLOW  LABSPEC 1.014  PHURINE 5.0  GLUCOSEU NEGATIVE  HGBUR NEGATIVE  BILIRUBINUR NEGATIVE  KETONESUR NEGATIVE  PROTEINUR 30*  UROBILINOGEN 0.2  NITRITE NEGATIVE  LEUKOCYTESUR NEGATIVE      Imaging: No results found.   Medications:   Prednisone 15 mg daily aranesp 100 q week   Assessment/ Plan:  81 y.o. female with medical problems of multiple myeloma status post autologous stem cell transplantation, recent acute renal failure secondary to acute glomerulonephritis syndrome with immune complex deposition in the setting of type III hypersensitivity reaction, recent acute respiratory failure, was [facility]/2014 for ongoing management.   1.  Likely progressed to ESRD as she remains dialysis dependent. Acute renal failure was presumed secondary to glomerulonephritis syndrome with immune complex deposition in the setting of type III hypersensitivity reaction [per [facility]]. (no biopsy). - Pt seen at bedside during dialysis. Dialyzing in chair today.  - continues to have cough. - blood transfusion today. UF as tolerated  2. Anemia of CKD:  with iron deficiency.  - Hgb 6.6 at last check, continue aranesp.  - blood transfusion 7/20  3. Secondary hyperparathyroidism: PTH 50, phos 1.8 at last check. - continue to monitor bone mineral metabolism parameters.  4. Hypertension: BP fluctuating, continue metoprolol for now.    Neizan Debruhl 7/20/20162:54 PM

## 2014-10-12 LAB — CBC WITH DIFFERENTIAL/PLATELET
Basophils Absolute: 0 10*3/uL (ref 0.0–0.1)
Basophils Relative: 0 % (ref 0–1)
Eosinophils Absolute: 0.8 10*3/uL — ABNORMAL HIGH (ref 0.0–0.7)
Eosinophils Relative: 5 % (ref 0–5)
HEMATOCRIT: 28.2 % — AB (ref 36.0–46.0)
Hemoglobin: 8.7 g/dL — ABNORMAL LOW (ref 12.0–15.0)
LYMPHS ABS: 0.7 10*3/uL (ref 0.7–4.0)
Lymphocytes Relative: 4 % — ABNORMAL LOW (ref 12–46)
MCH: 30.4 pg (ref 26.0–34.0)
MCHC: 30.9 g/dL (ref 30.0–36.0)
MCV: 98.6 fL (ref 78.0–100.0)
Monocytes Absolute: 1.2 10*3/uL — ABNORMAL HIGH (ref 0.1–1.0)
Monocytes Relative: 7 % (ref 3–12)
NEUTROS PCT: 84 % — AB (ref 43–77)
Neutro Abs: 13.8 10*3/uL — ABNORMAL HIGH (ref 1.7–7.7)
Platelets: 165 10*3/uL (ref 150–400)
RBC: 2.86 MIL/uL — AB (ref 3.87–5.11)
RDW: 22.8 % — AB (ref 11.5–15.5)
WBC: 16.5 10*3/uL — ABNORMAL HIGH (ref 4.0–10.5)

## 2014-10-13 LAB — CBC
HEMATOCRIT: 29.5 % — AB (ref 36.0–46.0)
Hemoglobin: 8.9 g/dL — ABNORMAL LOW (ref 12.0–15.0)
MCH: 29.8 pg (ref 26.0–34.0)
MCHC: 30.2 g/dL (ref 30.0–36.0)
MCV: 98.7 fL (ref 78.0–100.0)
Platelets: 171 10*3/uL (ref 150–400)
RBC: 2.99 MIL/uL — ABNORMAL LOW (ref 3.87–5.11)
RDW: 21.8 % — AB (ref 11.5–15.5)
WBC: 19.4 10*3/uL — AB (ref 4.0–10.5)

## 2014-10-13 LAB — RENAL FUNCTION PANEL
ALBUMIN: 2.2 g/dL — AB (ref 3.5–5.0)
ANION GAP: 10 (ref 5–15)
BUN: 59 mg/dL — ABNORMAL HIGH (ref 6–20)
CO2: 27 mmol/L (ref 22–32)
Calcium: 8.3 mg/dL — ABNORMAL LOW (ref 8.9–10.3)
Chloride: 101 mmol/L (ref 101–111)
Creatinine, Ser: 2.52 mg/dL — ABNORMAL HIGH (ref 0.44–1.00)
GFR calc non Af Amer: 17 mL/min — ABNORMAL LOW (ref >60)
GFR, EST AFRICAN AMERICAN: 20 mL/min — AB (ref >60)
Glucose, Bld: 156 mg/dL — ABNORMAL HIGH (ref 65–99)
PHOSPHORUS: 2.5 mg/dL (ref 2.5–4.6)
POTASSIUM: 3.8 mmol/L (ref 3.5–5.1)
Sodium: 138 mmol/L (ref 135–145)

## 2014-10-13 NOTE — Progress Notes (Signed)
Subjective:  No acute events Laying in the bed.  No complaints 2 feeds continued, Nepro, 40 cc per hour  Objective:  Vital signs in last 24 hours:  Temperature 90.7,pulse 98, respirations 18, blood pressure 130/60     Physical Exam: General: NAD, laying in bed  HEENT EOMI hearing intact OM moist  Neck supple  Pulm/lungs normal effort, clear ant/lat  CVS/Heart Regular, no rub  Abdomen:  Soft, non distended, +PEG  Extremities: ++ dependent edema  Neurologic: Alert, awake, follows comamnds  Skin: Reddish rash all over the body  Access: Rt IJ permcath   foley    Basic Metabolic Panel:  Recent Labs Lab 10/09/14 0604 10/11/14 0438 10/13/14 0525  NA 133* 137 138  K 3.7 3.9 3.8  CL 96* 100* 101  CO2 29 29 27   GLUCOSE 116* 100* 156*  BUN 96* 55* 59*  CREATININE 3.44* 2.36* 2.52*  CALCIUM 8.5* 8.1* 8.3*  PHOS 2.4* 1.8* 2.5     CBC:  Recent Labs Lab 10/09/14 0604 10/11/14 0438 10/12/14 0520 10/13/14 0535  WBC 16.9* 15.2* 16.5* 19.4*  NEUTROABS  --   --  13.8*  --   HGB 7.6* 6.6* 8.7* 8.9*  HCT 25.4* 22.4* 28.2* 29.5*  MCV 102.4* 102.3* 98.6 98.7  PLT 168 148* 165 171      Microbiology: Results for orders placed or performed during the hospital encounter of 08/31/2014  Culture, Urine     Status: None   Collection Time: 09/29/14  4:30 PM  Result Value Ref Range Status   Specimen Description URINE, RANDOM  Final   Special Requests NONE  Final   Culture NO GROWTH 2 DAYS  Final   Report Status 10/01/2014 FINAL  Final  Culture, expectorated sputum-assessment     Status: None   Collection Time: 10/04/14  6:40 PM  Result Value Ref Range Status   Specimen Description SPUTUM  Final   Special Requests NONE  Final   Sputum evaluation   Final    MICROSCOPIC FINDINGS SUGGEST THAT THIS SPECIMEN IS NOT REPRESENTATIVE OF LOWER RESPIRATORY SECRETIONS. PLEASE RECOLLECT. Bailey Poot RN 2015 10/04/14 [name]    Report Status 10/04/2014 FINAL  Final  Culture,  expectorated sputum-assessment     Status: None   Collection Time: 10/06/14  3:07 AM  Result Value Ref Range Status   Specimen Description SPUTUM  Final   Special Requests NONE  Final   Sputum evaluation   Final    THIS SPECIMEN IS ACCEPTABLE. RESPIRATORY CULTURE REPORT TO FOLLOW.   Report Status 10/06/2014 FINAL  Final  Culture, respiratory (NON-Expectorated)     Status: None   Collection Time: 10/06/14  3:07 AM  Result Value Ref Range Status   Specimen Description SPUTUM  Final   Special Requests NONE  Final   Gram Stain   Final    MODERATE WBC PRESENT,BOTH PMN AND MONONUCLEAR RARE SQUAMOUS EPITHELIAL CELLS PRESENT FEW GRAM NEGATIVE RODS    Culture   Final    ABUNDANT PSEUDOMONAS AERUGINOSA 19 Note: COLISTIN=3 uG/ML S ETEST results for this drug are "FOR INVESTIGATIONAL USE ONLY" and should NOT be used for clinical purposes. MULTIDRUG RESISTANT CRITICAL RESULT CALLED TO, READ BACK BY AND VERIFIED WITH: VANESSA DAVIS 7 16@0925  BY PARDA    Report Status 10/10/2014 FINAL  Final   Organism ID, Bacteria PSEUDOMONAS AERUGINOSA  Final      Susceptibility   Pseudomonas aeruginosa - MIC*    CEFEPIME Value in next row Resistant  32 RESISTANTPerformed at [facility]    CEFTAZIDIME Value in next row Intermediate      16 INTERMEDIATEPerformed at [facility]    CIPROFLOXACIN Value in next row Resistant      >=4 RESISTANTPerformed at [place] Value in next row Resistant      >=16 RESISTANTPerformed at [facility]    IMIPENEM Value in next row Resistant      >=16 RESISTANTPerformed at [facility]    PIP/TAZO Value in next row Sensitive      32 SENSITIVEPerformed at [facility]    TICAR/CLAVULANIC Value in next row Resistant      >=128 RESISTANTPerformed at [facility]    TOBRAMYCIN Value in next row Resistant      >=16 RESISTANTPerformed at [facility]    AMIKACIN Value in next row Intermediate       32 INTERMEDIATEPerformed at [place]  Culture, Urine     Status: None   Collection Time: 10/09/14 12:15 PM  Result Value Ref Range Status   Specimen Description URINE, RANDOM  Final   Special Requests NONE  Final   Culture NO GROWTH 1 DAY  Final   Report Status 10/10/2014 FINAL  Final    Coagulation Studies: No results for input(s): LABPROT, INR in the last 72 hours.  Urinalysis: No results for input(s): COLORURINE, LABSPEC, PHURINE, GLUCOSEU, HGBUR, BILIRUBINUR, KETONESUR, PROTEINUR, UROBILINOGEN, NITRITE, LEUKOCYTESUR in the last 72 hours.  Invalid input(s): APPERANCEUR    Imaging: No results found.   Medications:   Prednisone 15 mg daily aranesp 100 q week   Assessment/ Plan:  81 y.o. female with medical problems of multiple myeloma status post autologous stem cell transplantation, recent acute renal failure secondary to acute glomerulonephritis syndrome with immune complex deposition in the setting of type III hypersensitivity reaction, recent acute respiratory failure, was [facility]10/2014 for ongoing management.   1.  Likely progressed to ESRD as she remains dialysis dependent. Acute renal failure was presumed secondary to glomerulonephritis syndrome with immune complex deposition in the setting of type III hypersensitivity reaction [per [facility]]. (no biopsy). - Pt to be dialyzed later today, then Monday  2. Anemia of CKD:  with iron deficiency.  - Hgb 8.9 at last check, continue aranesp.  - blood transfusion given 7/20  3. Secondary hyperparathyroidism: PTH 50, phos 2.5 at last check. - continue to monitor bone mineral metabolism parameters.      Bailey Calderon 7/22/201611:52 AM

## 2014-10-14 LAB — HEPATITIS B SURFACE ANTIGEN: Hepatitis B Surface Ag: NEGATIVE

## 2014-10-14 LAB — HEPATITIS B CORE ANTIBODY, TOTAL: HEP B C TOTAL AB: NEGATIVE

## 2014-10-15 LAB — TYPE AND SCREEN
ABO/RH(D): O POS
Antibody Screen: NEGATIVE
Unit division: 0
Unit division: 0

## 2014-10-16 LAB — RENAL FUNCTION PANEL
ALBUMIN: 2.5 g/dL — AB (ref 3.5–5.0)
Anion gap: 10 (ref 5–15)
BUN: 85 mg/dL — AB (ref 6–20)
CO2: 27 mmol/L (ref 22–32)
CREATININE: 2.97 mg/dL — AB (ref 0.44–1.00)
Calcium: 7.9 mg/dL — ABNORMAL LOW (ref 8.9–10.3)
Chloride: 92 mmol/L — ABNORMAL LOW (ref 101–111)
GFR calc Af Amer: 16 mL/min — ABNORMAL LOW (ref >60)
GFR calc non Af Amer: 14 mL/min — ABNORMAL LOW (ref >60)
Glucose, Bld: 121 mg/dL — ABNORMAL HIGH (ref 65–99)
PHOSPHORUS: 5.2 mg/dL — AB (ref 2.5–4.6)
POTASSIUM: 4.3 mmol/L (ref 3.5–5.1)
Sodium: 129 mmol/L — ABNORMAL LOW (ref 135–145)

## 2014-10-16 LAB — CBC
HEMATOCRIT: 28.4 % — AB (ref 36.0–46.0)
HEMOGLOBIN: 8.6 g/dL — AB (ref 12.0–15.0)
MCH: 30.4 pg (ref 26.0–34.0)
MCHC: 30.3 g/dL (ref 30.0–36.0)
MCV: 100.4 fL — AB (ref 78.0–100.0)
Platelets: 170 10*3/uL (ref 150–400)
RBC: 2.83 MIL/uL — ABNORMAL LOW (ref 3.87–5.11)
RDW: 19.5 % — ABNORMAL HIGH (ref 11.5–15.5)
WBC: 11.4 10*3/uL — ABNORMAL HIGH (ref 4.0–10.5)

## 2014-10-16 NOTE — Progress Notes (Signed)
Subjective:  Face quite erythematous. Seen during HD. Given Midodrine and albumin pretreatment.   Objective:  Vital signs in last 24 hours:  98.4 94 16 100/57     Physical Exam: General: NAD, laying in bed  HEENT EOMI hearing intact OM moist  Neck supple  Pulm/lungs normal effort, clear ant/lat  CVS/Heart Regular, no rub  Abdomen:  Soft, non distended, +PEG  Extremities: ++ dependent edema  Neurologic: Alert, awake, follows comamnds  Skin: Reddish rash all over the body  Access: Rt IJ permcath   foley    Basic Metabolic Panel:  Recent Labs Lab 10/11/14 0438 10/13/14 0525 10/16/14 0440  NA 137 138 129*  K 3.9 3.8 4.3  CL 100* 101 92*  CO2 _0 GLUCOSE 100* 156* 121*  BUN 55* 59* 85*  CREATININE 2.36* 2.52* 2.97*  CALCIUM 8.1* 8.3* 7.9*  PHOS 1.8* 2.5 5.2*     CBC:  Recent Labs Lab 10/11/14 0438 10/12/14 0520 10/13/14 0535 10/16/14 0440  WBC 15.2* 16.5* 19.4* 11.4*  NEUTROABS  --  13.8*  --   --   HGB 6.6* 8.7* 8.9* 8.6*  HCT 22.4* 28.2* 29.5* 28.4*  MCV 102.3* 98.6 98.7 100.4*  PLT 148* 165 171 170      Microbiology: Results for orders placed or performed during the hospital encounter of 08/25/2014  Culture, Urine     Status: None   Collection Time: 09/29/14  4:30 PM  Result Value Ref Range Status   Specimen Description URINE, RANDOM  Final   Special Requests NONE  Final   Culture NO GROWTH 2 DAYS  Final   Report Status 10/01/2014 FINAL  Final  Culture, expectorated sputum-assessment     Status: None   Collection Time: 10/04/14  6:40 PM  Result Value Ref Range Status   Specimen Description SPUTUM  Final   Special Requests NONE  Final   Sputum evaluation   Final    MICROSCOPIC FINDINGS SUGGEST THAT THIS SPECIMEN IS NOT REPRESENTATIVE OF LOWER RESPIRATORY SECRETIONS. PLEASE RECOLLECT. Bailey Calderon 2015 10/04/14 [name]    Report Status 10/04/2014 FINAL  Final  Culture, expectorated sputum-assessment     Status: None   Collection Time: 10/06/14  3:07 AM  Result Value Ref Range Status   Specimen Description SPUTUM  Final   Special Requests NONE  Final   Sputum evaluation   Final    THIS SPECIMEN IS ACCEPTABLE. RESPIRATORY CULTURE REPORT TO FOLLOW.   Report Status 10/06/2014 FINAL  Final  Culture, respiratory (NON-Expectorated)     Status: None   Collection Time: 10/06/14  3:07 AM  Result Value Ref Range Status   Specimen Description SPUTUM  Final   Special Requests NONE  Final   Gram Stain   Final    MODERATE WBC PRESENT,BOTH PMN AND MONONUCLEAR RARE SQUAMOUS EPITHELIAL CELLS PRESENT FEW GRAM NEGATIVE RODS    Culture   Final    ABUNDANT PSEUDOMONAS AERUGINOSA 19 Note: COLISTIN=3 uG/ML S ETEST results for this drug are "FOR INVESTIGATIONAL USE ONLY" and should NOT be used for clinical purposes. MULTIDRUG RESISTANT CRITICAL RESULT CALLED TO, READ BACK BY AND VERIFIED WITH: Bailey Calderon 7 16_1  BY PARDA    Report Status 10/10/2014 FINAL  Final   Organism ID, Bacteria PSEUDOMONAS AERUGINOSA  Final      Susceptibility   Pseudomonas aeruginosa - MIC*    CEFEPIME Value in next row Resistant      32 RESISTANTPerformed at [facility]  CEFTAZIDIME Value in next row Intermediate      16 INTERMEDIATEPerformed at [facility]    CIPROFLOXACIN Value in next row Resistant      >=4 RESISTANTPerformed at [place] Value in next row Resistant      >=16 RESISTANTPerformed at [facility]    IMIPENEM Value in next row Resistant      >=16 RESISTANTPerformed at [facility]    PIP/TAZO Value in next row Sensitive      32 SENSITIVEPerformed at [facility]    TICAR/CLAVULANIC Value in next row Resistant      >=128 RESISTANTPerformed at [facility]    TOBRAMYCIN Value in next row Resistant      >=16 RESISTANTPerformed at [facility]    AMIKACIN Value in next row Intermediate      32 INTERMEDIATEPerformed at [place]  Culture, Urine     Status: None   Collection Time: 07/Bailey/16 12:15 PM  Result Value Ref Range Status   Specimen Description URINE, RANDOM  Final   Special Requests NONE  Final   Culture NO GROWTH 1 DAY  Final   Report Status 10/10/2014 FINAL  Final    Coagulation Studies: No results for input(s): LABPROT, INR in the last 72 hours.  Urinalysis: No results for input(s): COLORURINE, LABSPEC, PHURINE, GLUCOSEU, HGBUR, BILIRUBINUR, KETONESUR, PROTEINUR, UROBILINOGEN, NITRITE, LEUKOCYTESUR in the last 72 hours.  Invalid input(s): APPERANCEUR    Imaging: No results found.   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   1.  ESRD established 10/16/14. Acute renal failure was presumed secondary to glomerulonephritis syndrome with immune complex deposition in the setting of type III hypersensitivity reaction [per [facility]]. (no biopsy). - Pt appears to have progressed to ESRD, egfr down to 14 off of dialysis over the weekend.  Still making urine.  Seen during HD today.  Was given midodrine and albumin pretreatment, may repeat albumin x 1 in second hour of treatment.  2. Anemia of CKD:  with iron deficiency.  - Hgb 8.6, continue aranesp, also received blood transfusion thsi admission.  3. Secondary hyperparathyroidism: PTH 50, phos up to 5.2, continue to monitor bone mineral metabolism parameters.     Bailey Calderon 7/25/20163:40 PM

## 2014-10-18 LAB — RENAL FUNCTION PANEL
Albumin: 2.5 g/dL — ABNORMAL LOW (ref 3.5–5.0)
Anion gap: 7 (ref 5–15)
BUN: 82 mg/dL — ABNORMAL HIGH (ref 6–20)
CALCIUM: 8.3 mg/dL — AB (ref 8.9–10.3)
CO2: 28 mmol/L (ref 22–32)
Chloride: 99 mmol/L — ABNORMAL LOW (ref 101–111)
Creatinine, Ser: 2.59 mg/dL — ABNORMAL HIGH (ref 0.44–1.00)
GFR calc Af Amer: 19 mL/min — ABNORMAL LOW (ref >60)
GFR, EST NON AFRICAN AMERICAN: 17 mL/min — AB (ref >60)
GLUCOSE: 154 mg/dL — AB (ref 65–99)
POTASSIUM: 4.7 mmol/L (ref 3.5–5.1)
Phosphorus: 3.6 mg/dL (ref 2.5–4.6)
Sodium: 134 mmol/L — ABNORMAL LOW (ref 135–145)

## 2014-10-18 LAB — CBC
HCT: 25.7 % — ABNORMAL LOW (ref 36.0–46.0)
HEMOGLOBIN: 7.6 g/dL — AB (ref 12.0–15.0)
MCH: 29.8 pg (ref 26.0–34.0)
MCHC: 29.6 g/dL — ABNORMAL LOW (ref 30.0–36.0)
MCV: 100.8 fL — ABNORMAL HIGH (ref 78.0–100.0)
PLATELETS: 168 10*3/uL (ref 150–400)
RBC: 2.55 MIL/uL — ABNORMAL LOW (ref 3.87–5.11)
RDW: 19.3 % — ABNORMAL HIGH (ref 11.5–15.5)
WBC: 11.9 10*3/uL — AB (ref 4.0–10.5)

## 2014-10-18 NOTE — Progress Notes (Signed)
Subjective:  Pt seen during HD. BFR 400. Sitting up in chair.  Objective:  Vital signs in last 24 hours:  95.7 92 114/55     Physical Exam: General: NAD, laying in bed  HEENT EOMI hearing intact OM moist, face reddish   Neck supple  Pulm/lungs normal effort, clear ant/lat  CVS/Heart Regular, no rub  Abdomen:  Soft, non distended, +PEG  Extremities: ++ dependent edema  Neurologic: Alert, awake, follows comamnds  Skin: Reddish rash all over the body  Access: Rt IJ permcath   foley    Basic Metabolic Panel:  Recent Labs Lab 10/13/14 0525 10/16/14 0440 10/18/14 0602  NA 138 129* 134*  K 3.8 4.3 4.7  CL 101 92* 99*  CO2 27 27 28   GLUCOSE 156* 121* 154*  BUN 59* 85* 82*  CREATININE 2.52* 2.97* 2.59*  CALCIUM 8.3* 7.9* 8.3*  PHOS 2.5 5.2* 3.6     CBC:  Recent Labs Lab 10/12/14 0520 10/13/14 0535 10/16/14 0440 10/18/14 0602  WBC 16.5* 19.4* 11.4* 11.9*  NEUTROABS 13.8*  --   --   --   HGB 8.7* 8.9* 8.6* 7.6*  HCT 28.2* 29.5* 28.4* 25.7*  MCV 98.6 98.7 100.4* 100.8*  PLT 165 171 170 168      Microbiology: Results for orders placed or performed during the hospital encounter of 09/21/2014  Culture, Urine     Status: None   Collection Time: 09/29/14  4:30 PM  Result Value Ref Range Status   Specimen Description URINE, RANDOM  Final   Special Requests NONE  Final   Culture NO GROWTH 2 DAYS  Final   Report Status 10/01/2014 FINAL  Final  Culture, expectorated sputum-assessment     Status: None   Collection Time: 10/04/14  6:40 PM  Result Value Ref Range Status   Specimen Description SPUTUM  Final   Special Requests NONE  Final   Sputum evaluation   Final    MICROSCOPIC FINDINGS SUGGEST THAT THIS SPECIMEN IS NOT REPRESENTATIVE OF LOWER RESPIRATORY SECRETIONS. PLEASE RECOLLECT. Juliane Poot RN 2015 10/04/14 [name]    Report Status 10/04/2014 FINAL  Final  Culture, expectorated sputum-assessment     Status: None   Collection Time: 10/06/14  3:07  AM  Result Value Ref Range Status   Specimen Description SPUTUM  Final   Special Requests NONE  Final   Sputum evaluation   Final    THIS SPECIMEN IS ACCEPTABLE. RESPIRATORY CULTURE REPORT TO FOLLOW.   Report Status 10/06/2014 FINAL  Final  Culture, respiratory (NON-Expectorated)     Status: None   Collection Time: 10/06/14  3:07 AM  Result Value Ref Range Status   Specimen Description SPUTUM  Final   Special Requests NONE  Final   Gram Stain   Final    MODERATE WBC PRESENT,BOTH PMN AND MONONUCLEAR RARE SQUAMOUS EPITHELIAL CELLS PRESENT FEW GRAM NEGATIVE RODS    Culture   Final    ABUNDANT PSEUDOMONAS AERUGINOSA 19 Note: COLISTIN=3 uG/ML S ETEST results for this drug are "FOR INVESTIGATIONAL USE ONLY" and should NOT be used for clinical purposes. MULTIDRUG RESISTANT CRITICAL RESULT CALLED TO, READ BACK BY AND VERIFIED WITH: VANESSA DAVIS 7 16@0925  BY PARDA    Report Status 10/10/2014 FINAL  Final   Organism ID, Bacteria PSEUDOMONAS AERUGINOSA  Final      Susceptibility   Pseudomonas aeruginosa - MIC*    CEFEPIME Value in next row Resistant      32 RESISTANTPerformed at [facility]  CEFTAZIDIME Value in next row Intermediate      16 INTERMEDIATEPerformed at [facility]    CIPROFLOXACIN Value in next row Resistant      >=4 RESISTANTPerformed at [place] Value in next row Resistant      >=16 RESISTANTPerformed at [facility]    IMIPENEM Value in next row Resistant      >=16 RESISTANTPerformed at [facility]    PIP/TAZO Value in next row Sensitive      32 SENSITIVEPerformed at [facility]    TICAR/CLAVULANIC Value in next row Resistant      >=128 RESISTANTPerformed at [facility]    TOBRAMYCIN Value in next row Resistant      >=16 RESISTANTPerformed at [facility]    AMIKACIN Value in next row Intermediate      32 INTERMEDIATEPerformed at [place]  Culture, Urine     Status: None   Collection Time: 10/09/14 12:15 PM  Result Value Ref Range Status   Specimen Description URINE, RANDOM  Final   Special Requests NONE  Final   Culture NO GROWTH 1 DAY  Final   Report Status 10/10/2014 FINAL  Final    Coagulation Studies: No results for input(s): LABPROT, INR in the last 72 hours.  Urinalysis: No results for input(s): COLORURINE, LABSPEC, PHURINE, GLUCOSEU, HGBUR, BILIRUBINUR, KETONESUR, PROTEINUR, UROBILINOGEN, NITRITE, LEUKOCYTESUR in the last 72 hours.  Invalid input(s): APPERANCEUR    Imaging: No results found.   Medications:   Transitioned back to solumedrol aranesp 100 q week   Assessment/ Plan:  81 y.o. 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   1.  ESRD established 10/16/14. Acute renal failure was presumed secondary to glomerulonephritis syndrome with immune complex deposition in the setting of type III hypersensitivity reaction [per [facility]]. (no biopsy), with progression to ESRD. - Pt seen during HD today, given albumin pretreatment, would continue to give this.  Will plan for HD again on Friday.  2. Anemia of CKD:  with iron deficiency.  - Hgb down to 7.6, lower than before, continue aranesp 15mg [place] q week, transfuse as necessary.  3. Secondary hyperparathyroidism: PTH 50, phos down to 3.6    Mahrosh Donnell 7/27/20164:19 PM

## 2014-10-20 LAB — RENAL FUNCTION PANEL
ALBUMIN: 2.7 g/dL — AB (ref 3.5–5.0)
ANION GAP: 6 (ref 5–15)
BUN: 60 mg/dL — AB (ref 6–20)
CO2: 31 mmol/L (ref 22–32)
CREATININE: 2.28 mg/dL — AB (ref 0.44–1.00)
Calcium: 8.8 mg/dL — ABNORMAL LOW (ref 8.9–10.3)
Chloride: 98 mmol/L — ABNORMAL LOW (ref 101–111)
GFR calc non Af Amer: 19 mL/min — ABNORMAL LOW (ref >60)
GFR, EST AFRICAN AMERICAN: 22 mL/min — AB (ref >60)
GLUCOSE: 135 mg/dL — AB (ref 65–99)
POTASSIUM: 4.6 mmol/L (ref 3.5–5.1)
Phosphorus: 3.1 mg/dL (ref 2.5–4.6)
SODIUM: 135 mmol/L (ref 135–145)

## 2014-10-20 LAB — CBC
HCT: 28.4 % — ABNORMAL LOW (ref 36.0–46.0)
HEMOGLOBIN: 8.4 g/dL — AB (ref 12.0–15.0)
MCH: 30.2 pg (ref 26.0–34.0)
MCHC: 29.6 g/dL — AB (ref 30.0–36.0)
MCV: 102.2 fL — ABNORMAL HIGH (ref 78.0–100.0)
Platelets: 180 10*3/uL (ref 150–400)
RBC: 2.78 MIL/uL — AB (ref 3.87–5.11)
RDW: 19 % — ABNORMAL HIGH (ref 11.5–15.5)
WBC: 11.5 10*3/uL — ABNORMAL HIGH (ref 4.0–10.5)

## 2014-10-20 NOTE — Progress Notes (Signed)
Subjective:  Pt completed HD earlier today. 2500 cc removed bfr 400. DFR 800  Objective:  Vital signs in last 24 hours:  98.5  98  20  122/63       Physical Exam: General: NAD, laying in bed  HEENT EOMI hearing intact OM moist, face reddish   Neck supple  Pulm/lungs normal effort, clear ant/lat  CVS/Heart Regular, no rub  Abdomen:  Soft, non distended, +PEG  Extremities: ++ dependent edema  Neurologic: Alert, awake, follows comamnds  Skin: Reddish rash all over the body  Access: Rt IJ permcath   foley    Basic Metabolic Panel:  Recent Labs Lab 10/16/14 0440 10/18/14 0602 10/20/14 0410  NA 129* 134* 135  K 4.3 4.7 4.6  CL 92* 99* 98*  CO2 27 28 31   GLUCOSE 121* 154* 135*  BUN 85* 82* 60*  CREATININE 2.97* 2.59* 2.28*  CALCIUM 7.9* 8.3* 8.8*  PHOS 5.2* 3.6 3.1     CBC:  Recent Labs Lab 10/16/14 0440 10/18/14 0602 10/20/14 0410  WBC 11.4* 11.9* 11.5*  HGB 8.6* 7.6* 8.4*  HCT 28.4* 25.7* 28.4*  MCV 100.4* 100.8* 102.2*  PLT 170 168 180      Microbiology: Results for orders placed or performed during the hospital encounter of 09/07/2014  Culture, Urine     Status: None   Collection Time: 09/29/14  4:30 PM  Result Value Ref Range Status   Specimen Description URINE, RANDOM  Final   Special Requests NONE  Final   Culture NO GROWTH 2 DAYS  Final   Report Status 10/01/2014 FINAL  Final  Culture, expectorated sputum-assessment     Status: None   Collection Time: 10/04/14  6:40 PM  Result Value Ref Range Status   Specimen Description SPUTUM  Final   Special Requests NONE  Final   Sputum evaluation   Final    MICROSCOPIC FINDINGS SUGGEST THAT THIS SPECIMEN IS NOT REPRESENTATIVE OF LOWER RESPIRATORY SECRETIONS. PLEASE RECOLLECT. Juliane Poot RN 2015 10/04/14 [name]    Report Status 10/04/2014 FINAL  Final  Culture, expectorated sputum-assessment     Status: None   Collection Time: 10/06/14  3:07 AM  Result Value Ref Range Status   Specimen  Description SPUTUM  Final   Special Requests NONE  Final   Sputum evaluation   Final    THIS SPECIMEN IS ACCEPTABLE. RESPIRATORY CULTURE REPORT TO FOLLOW.   Report Status 10/06/2014 FINAL  Final  Culture, respiratory (NON-Expectorated)     Status: None   Collection Time: 10/06/14  3:07 AM  Result Value Ref Range Status   Specimen Description SPUTUM  Final   Special Requests NONE  Final   Gram Stain   Final    MODERATE WBC PRESENT,BOTH PMN AND MONONUCLEAR RARE SQUAMOUS EPITHELIAL CELLS PRESENT FEW GRAM NEGATIVE RODS    Culture   Final    ABUNDANT PSEUDOMONAS AERUGINOSA 19 Note: COLISTIN=3 uG/ML S ETEST results for this drug are "FOR INVESTIGATIONAL USE ONLY" and should NOT be used for clinical purposes. MULTIDRUG RESISTANT CRITICAL RESULT CALLED TO, READ BACK BY AND VERIFIED WITH: VANESSA DAVIS 7 16@0925  BY PARDA    Report Status 10/10/2014 FINAL  Final   Organism ID, Bacteria PSEUDOMONAS AERUGINOSA  Final      Susceptibility   Pseudomonas aeruginosa - MIC*    CEFEPIME Value in next row Resistant      32 RESISTANTPerformed at [facility]    CEFTAZIDIME Value in next row Intermediate  16 INTERMEDIATEPerformed at [facility]    CIPROFLOXACIN Value in next row Resistant      >=4 RESISTANTPerformed at [place] Value in next row Resistant      >=16 RESISTANTPerformed at [facility]    IMIPENEM Value in next row Resistant      >=16 RESISTANTPerformed at [facility]    PIP/TAZO Value in next row Sensitive      32 SENSITIVEPerformed at [facility]    TICAR/CLAVULANIC Value in next row Resistant      >=128 RESISTANTPerformed at [facility]    TOBRAMYCIN Value in next row Resistant      >=16 RESISTANTPerformed at [facility]    AMIKACIN Value in next row Intermediate      32 INTERMEDIATEPerformed at [place]  Culture, Urine     Status: None    Collection Time: 10/09/14 12:15 PM  Result Value Ref Range Status   Specimen Description URINE, RANDOM  Final   Special Requests NONE  Final   Culture NO GROWTH 1 DAY  Final   Report Status 10/10/2014 FINAL  Final    Coagulation Studies: No results for input(s): LABPROT, INR in the last 72 hours.  Urinalysis: No results for input(s): COLORURINE, LABSPEC, PHURINE, GLUCOSEU, HGBUR, BILIRUBINUR, KETONESUR, PROTEINUR, UROBILINOGEN, NITRITE, LEUKOCYTESUR in the last 72 hours.  Invalid input(s): APPERANCEUR    Imaging: No results found.   Medications:   Transitioned back to solumedrol aranesp 100 q week   Assessment/ Plan:  81 y.o. female with medical problems of multiple myeloma status post autologous stem cell transplantation, recent acute renal failure secondary to acute glomerulonephritis syndrome with immune complex deposition in the setting of type III hypersensitivity reaction, recent acute respiratory failure, [facility].   1.  ESRD established 10/16/14. Acute renal failure was presumed secondary to glomerulonephritis syndrome with immune complex deposition in the setting of type III hypersensitivity reaction [per [facility]]. (no biopsy), with progression to ESRD. - Pt did HD today, given albumin pretreatment, would continue to give this.     2. Anemia of CKD:  with iron deficiency.  - Hgb down to 8.4, Continue aranesp 128mg [place] q week, transfuse as necessary.  3. Secondary hyperparathyroidism: PTH 50, phos down to 3.1    Claressa Hughley 7/29/20164:08 PM

## 2014-10-23 LAB — CBC
HEMATOCRIT: 30.3 % — AB (ref 36.0–46.0)
Hemoglobin: 8.9 g/dL — ABNORMAL LOW (ref 12.0–15.0)
MCH: 30.5 pg (ref 26.0–34.0)
MCHC: 29.4 g/dL — ABNORMAL LOW (ref 30.0–36.0)
MCV: 103.8 fL — ABNORMAL HIGH (ref 78.0–100.0)
PLATELETS: 176 10*3/uL (ref 150–400)
RBC: 2.92 MIL/uL — ABNORMAL LOW (ref 3.87–5.11)
RDW: 18.9 % — AB (ref 11.5–15.5)
WBC: 13.9 10*3/uL — AB (ref 4.0–10.5)

## 2014-10-23 LAB — RENAL FUNCTION PANEL
ALBUMIN: 2.5 g/dL — AB (ref 3.5–5.0)
Anion gap: 6 (ref 5–15)
BUN: 100 mg/dL — ABNORMAL HIGH (ref 6–20)
CALCIUM: 9.9 mg/dL (ref 8.9–10.3)
CHLORIDE: 99 mmol/L — AB (ref 101–111)
CO2: 30 mmol/L (ref 22–32)
Creatinine, Ser: 3.44 mg/dL — ABNORMAL HIGH (ref 0.44–1.00)
GFR, EST AFRICAN AMERICAN: 14 mL/min — AB (ref >60)
GFR, EST NON AFRICAN AMERICAN: 12 mL/min — AB (ref >60)
GLUCOSE: 113 mg/dL — AB (ref 65–99)
Phosphorus: 5.8 mg/dL — ABNORMAL HIGH (ref 2.5–4.6)
Potassium: 4.3 mmol/L (ref 3.5–5.1)
SODIUM: 135 mmol/L (ref 135–145)

## 2014-10-23 NOTE — Progress Notes (Signed)
Subjective:  Pt currently on bipap. Due for HD again today.  Overall remains quite ill.  Objective:  Vital signs in last 24 hours:  95.4 91 15 108/66     Physical Exam: General: NAD, on bipap  HEENT EOMI bipap face mask on  Neck supple  Pulm/lungs Scattered rhonchi, on bipap  CVS/Heart Regular, no rub  Abdomen:  Soft, non distended, +PEG  Extremities: ++ dependent edema  Neurologic: Resting comfortably, arousable, follows commands  Skin: Decreased diffuse rash  Access: Rt IJ permcath   foley    Basic Metabolic Panel:  Recent Labs Lab 10/18/14 0602 10/20/14 0410  NA 134* 135  K 4.7 4.6  CL 99* 98*  CO2 28 31  GLUCOSE 154* 135*  BUN 82* 60*  CREATININE 2.59* 2.28*  CALCIUM 8.3* 8.8*  PHOS 3.6 3.1     CBC:  Recent Labs Lab 10/18/14 0602 10/20/14 0410  WBC 11.9* 11.5*  HGB 7.6* 8.4*  HCT 25.7* 28.4*  MCV 100.8* 102.2*  PLT 168 180      Microbiology: Results for orders placed or performed during the hospital encounter of 08/23/2014  Culture, Urine     Status: None   Collection Time: 09/29/14  4:30 PM  Result Value Ref Range Status   Specimen Description URINE, RANDOM  Final   Special Requests NONE  Final   Culture NO GROWTH 2 DAYS  Final   Report Status 10/01/2014 FINAL  Final  Culture, expectorated sputum-assessment     Status: None   Collection Time: 10/04/14  6:40 PM  Result Value Ref Range Status   Specimen Description SPUTUM  Final   Special Requests NONE  Final   Sputum evaluation   Final    MICROSCOPIC FINDINGS SUGGEST THAT THIS SPECIMEN IS NOT REPRESENTATIVE OF LOWER RESPIRATORY SECRETIONS. PLEASE RECOLLECT. Juliane Poot RN 2015 10/04/14 [name]    Report Status 10/04/2014 FINAL  Final  Culture, expectorated sputum-assessment     Status: None   Collection Time: 10/06/14  3:07 AM  Result Value Ref Range Status   Specimen Description SPUTUM  Final   Special Requests NONE  Final   Sputum evaluation   Final    THIS SPECIMEN IS  ACCEPTABLE. RESPIRATORY CULTURE REPORT TO FOLLOW.   Report Status 10/06/2014 FINAL  Final  Culture, respiratory (NON-Expectorated)     Status: None   Collection Time: 10/06/14  3:07 AM  Result Value Ref Range Status   Specimen Description SPUTUM  Final   Special Requests NONE  Final   Gram Stain   Final    MODERATE WBC PRESENT,BOTH PMN AND MONONUCLEAR RARE SQUAMOUS EPITHELIAL CELLS PRESENT FEW GRAM NEGATIVE RODS    Culture   Final    ABUNDANT PSEUDOMONAS AERUGINOSA 19 Note: COLISTIN=3 uG/ML S ETEST results for this drug are "FOR INVESTIGATIONAL USE ONLY" and should NOT be used for clinical purposes. MULTIDRUG RESISTANT CRITICAL RESULT CALLED TO, READ BACK BY AND VERIFIED WITH: VANESSA DAVIS 7 16@0925  BY PARDA    Report Status 10/10/2014 FINAL  Final   Organism ID, Bacteria PSEUDOMONAS AERUGINOSA  Final      Susceptibility   Pseudomonas aeruginosa - MIC*    CEFEPIME Value in next row Resistant      32 RESISTANTPerformed at [facility]    CEFTAZIDIME Value in next row Intermediate      16 INTERMEDIATEPerformed at [facility]    CIPROFLOXACIN Value in next row Resistant      >=4 RESISTANTPerformed at [facility]    GENTAMICIN Value in next row Resistant      >=16 RESISTANTPerformed at [facility]    IMIPENEM Value in next row Resistant      >=16 RESISTANTPerformed at [facility]    PIP/TAZO Value in next row Sensitive      32 SENSITIVEPerformed at [facility]    TICAR/CLAVULANIC Value in next row Resistant      >=128 RESISTANTPerformed at [facility]    TOBRAMYCIN Value in next row Resistant      >=16 RESISTANTPerformed at [facility]    AMIKACIN Value in next row Intermediate      32 INTERMEDIATEPerformed at [facility]  Culture, Urine     Status: None   Collection Time: 10/09/14 12:15 PM  Result Value Ref Range Status   Specimen Description URINE, RANDOM   Final   Special Requests NONE  Final   Culture NO GROWTH 1 DAY  Final   Report Status 10/10/2014 FINAL  Final    Coagulation Studies: No results for input(s): LABPROT, INR in the last 72 hours.  Urinalysis: No results for input(s): COLORURINE, LABSPEC, PHURINE, GLUCOSEU, HGBUR, BILIRUBINUR, KETONESUR, PROTEINUR, UROBILINOGEN, NITRITE, LEUKOCYTESUR in the last 72 hours.  Invalid input(s): APPERANCEUR    Imaging: No results found.   Medications:   Transitioned back to solumedrol aranesp 100 q week   Assessment/ Plan:  80 y.o. 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   1.  ESRD established 10/16/14. Acute renal failure was presumed secondary to glomerulonephritis syndrome with immune complex deposition in the setting of type III hypersensitivity reaction [per [facility]]. (no biopsy), with progression to ESRD. - Pt due for HD today, orders prepared, UF target 2kg with use of albumin.    2. Anemia of CKD:  with iron deficiency.  - Hgb 8.4 at last check, up from 7.6, continue aranesp.  3. Secondary hyperparathyroidism: Repeat phos today.     Bailey Calderon 8/1/20167:01 AM

## 2014-10-24 ENCOUNTER — Other Ambulatory Visit (HOSPITAL_COMMUNITY): Payer: Medicare Other

## 2014-10-24 LAB — CBC
HCT: 31.2 % — ABNORMAL LOW (ref 36.0–46.0)
HEMOGLOBIN: 8.8 g/dL — AB (ref 12.0–15.0)
MCH: 29.6 pg (ref 26.0–34.0)
MCHC: 28.2 g/dL — ABNORMAL LOW (ref 30.0–36.0)
MCV: 105.1 fL — AB (ref 78.0–100.0)
Platelets: 167 10*3/uL (ref 150–400)
RBC: 2.97 MIL/uL — AB (ref 3.87–5.11)
RDW: 19.3 % — ABNORMAL HIGH (ref 11.5–15.5)
WBC: 12.7 10*3/uL — ABNORMAL HIGH (ref 4.0–10.5)

## 2014-10-24 LAB — RENAL FUNCTION PANEL
Albumin: 2.6 g/dL — ABNORMAL LOW (ref 3.5–5.0)
Anion gap: 5 (ref 5–15)
BUN: 51 mg/dL — AB (ref 6–20)
CHLORIDE: 99 mmol/L — AB (ref 101–111)
CO2: 31 mmol/L (ref 22–32)
CREATININE: 2.24 mg/dL — AB (ref 0.44–1.00)
Calcium: 8.9 mg/dL (ref 8.9–10.3)
GFR calc non Af Amer: 20 mL/min — ABNORMAL LOW (ref >60)
GFR, EST AFRICAN AMERICAN: 23 mL/min — AB (ref >60)
GLUCOSE: 115 mg/dL — AB (ref 65–99)
PHOSPHORUS: 3.9 mg/dL (ref 2.5–4.6)
POTASSIUM: 4.2 mmol/L (ref 3.5–5.1)
SODIUM: 135 mmol/L (ref 135–145)

## 2014-10-25 LAB — RENAL FUNCTION PANEL
Albumin: 2.5 g/dL — ABNORMAL LOW (ref 3.5–5.0)
Anion gap: 7 (ref 5–15)
BUN: 74 mg/dL — ABNORMAL HIGH (ref 6–20)
CHLORIDE: 100 mmol/L — AB (ref 101–111)
CO2: 29 mmol/L (ref 22–32)
CREATININE: 2.75 mg/dL — AB (ref 0.44–1.00)
Calcium: 9.2 mg/dL (ref 8.9–10.3)
GFR calc Af Amer: 18 mL/min — ABNORMAL LOW (ref >60)
GFR, EST NON AFRICAN AMERICAN: 15 mL/min — AB (ref >60)
Glucose, Bld: 140 mg/dL — ABNORMAL HIGH (ref 65–99)
POTASSIUM: 5.1 mmol/L (ref 3.5–5.1)
Phosphorus: 3.6 mg/dL (ref 2.5–4.6)
Sodium: 136 mmol/L (ref 135–145)

## 2014-10-25 LAB — CBC
HCT: 28.4 % — ABNORMAL LOW (ref 36.0–46.0)
Hemoglobin: 8.4 g/dL — ABNORMAL LOW (ref 12.0–15.0)
MCH: 30.7 pg (ref 26.0–34.0)
MCHC: 29.6 g/dL — ABNORMAL LOW (ref 30.0–36.0)
MCV: 103.6 fL — ABNORMAL HIGH (ref 78.0–100.0)
PLATELETS: 131 10*3/uL — AB (ref 150–400)
RBC: 2.74 MIL/uL — ABNORMAL LOW (ref 3.87–5.11)
RDW: 18.9 % — ABNORMAL HIGH (ref 11.5–15.5)
WBC: 18.1 10*3/uL — AB (ref 4.0–10.5)

## 2014-10-25 LAB — PTH, INTACT AND CALCIUM
CALCIUM TOTAL (PTH): 8.6 mg/dL — AB (ref 8.7–10.3)
PTH: 43 pg/mL (ref 15–65)

## 2014-10-26 LAB — PTH, INTACT AND CALCIUM
CALCIUM TOTAL (PTH): 8.8 mg/dL (ref 8.7–10.3)
PTH: 33 pg/mL (ref 15–65)

## 2014-11-23 DEATH — deceased

## 2017-05-01 IMAGING — CR DG ABD PORTABLE 1V
1 series · 1 of 1 positions shown · non-contrast
Comparison: None.

CLINICAL DATA: Evaluate PEG tube placement

EXAM:
PORTABLE ABDOMEN - 1 VIEW

[AP]
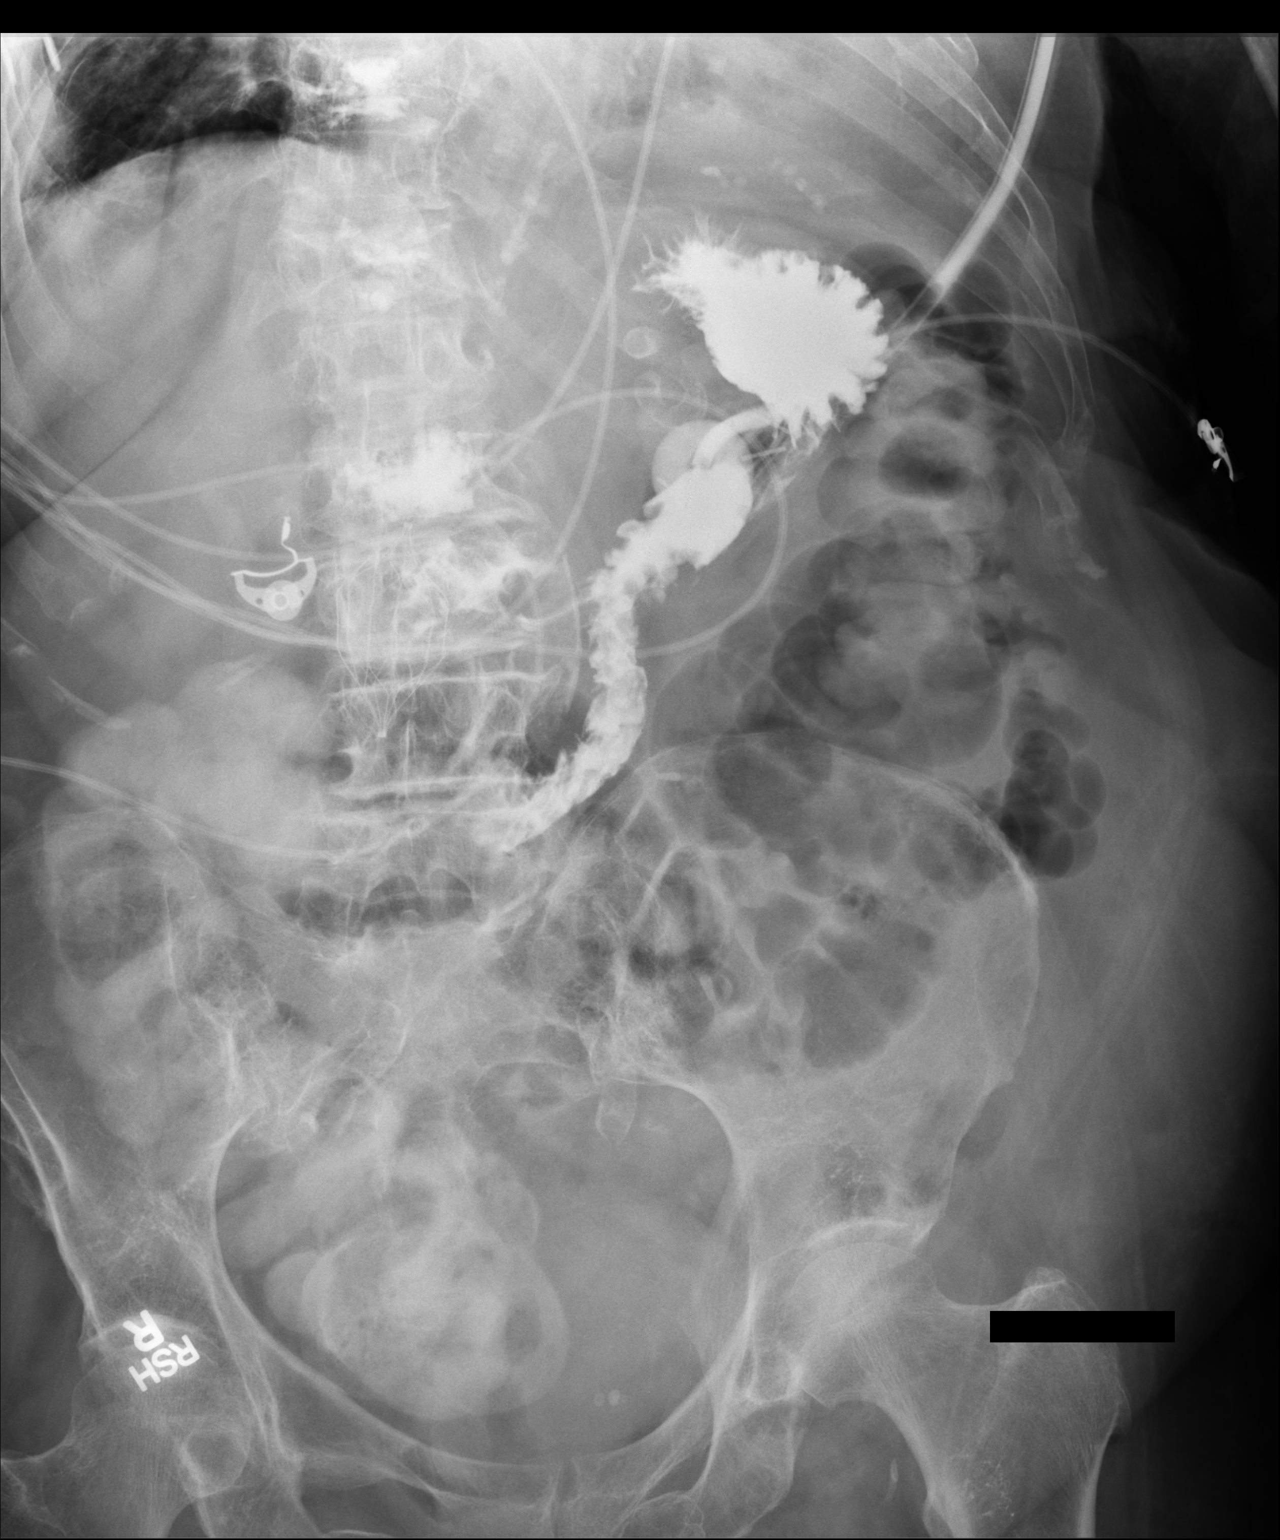

[1 of 1 positions shown; findings below may reference images not displayed]

FINDINGS: A portable film of the abdomen shows contrast has been injected via
the PEG tube, by history 50 cc of Omnipaque 300 were injected. The
contrast does fill a portion of the stomach and extends into the
small bowel. Therefore no extravasation is seen. The bowel gas
pattern is nonspecific.
IMPRESSION: The tip of the PEG tube appears to be with within the stomach with
no extravasation noted.

## 2017-05-02 IMAGING — XA IR REPLACE G-TUBE/COLONIC TUBE
1 series · 8 of 8 positions shown · non-contrast
Comparison: Abdominal radiograph- 09/14/2014

INDICATION: Gastrostomy tube fell out. Gastrostomy tract maintained with a Foley
catheter. Please performed fluoroscopic guided gastrostomy tube
exchange

EXAM:
FLUOROSCOPIC GUIDED REPLACEMENT OF GASTROSTOMY TUBE

[Series 1: run · 8 of 8 slices shown]
[im 1/8]
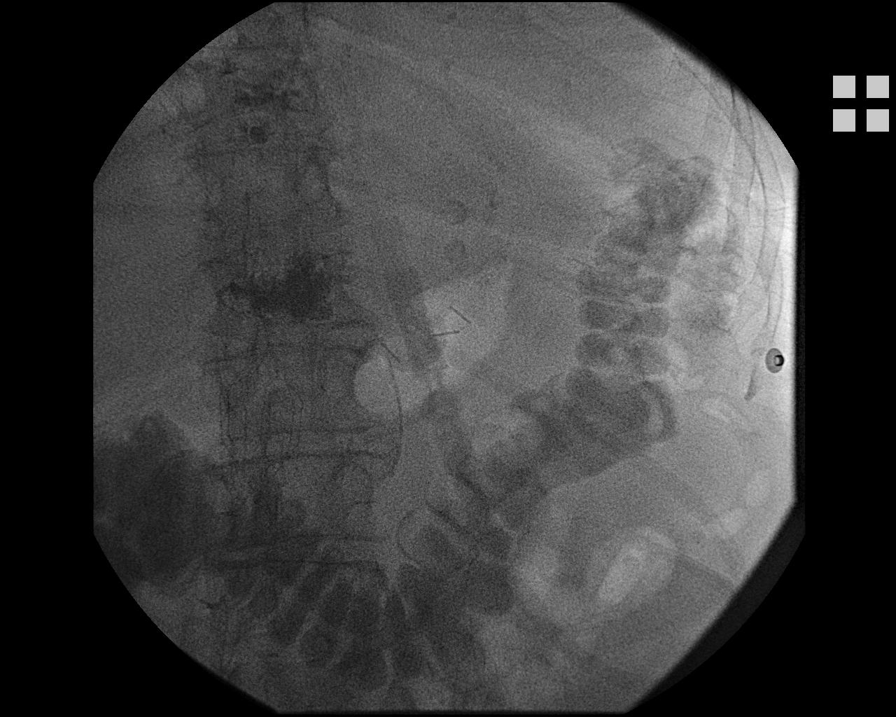
[im 2/8]
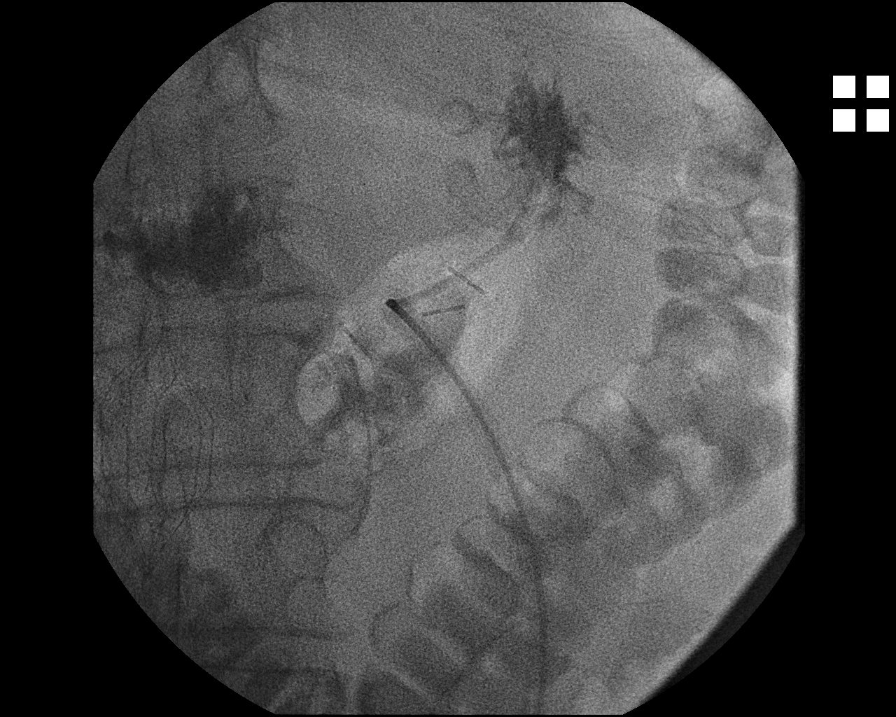
[im 3/8]
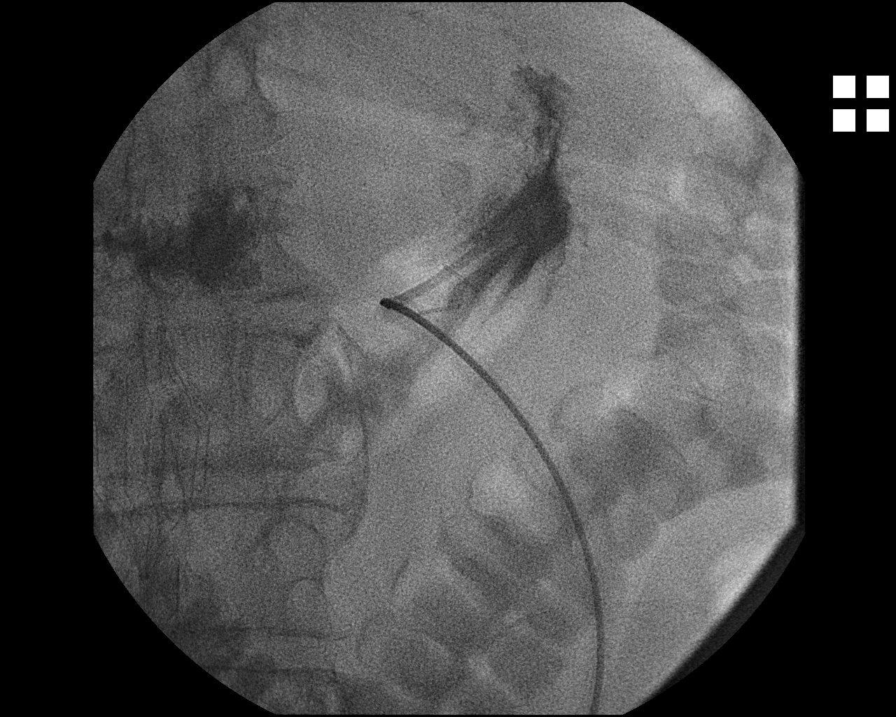
[im 4/8]
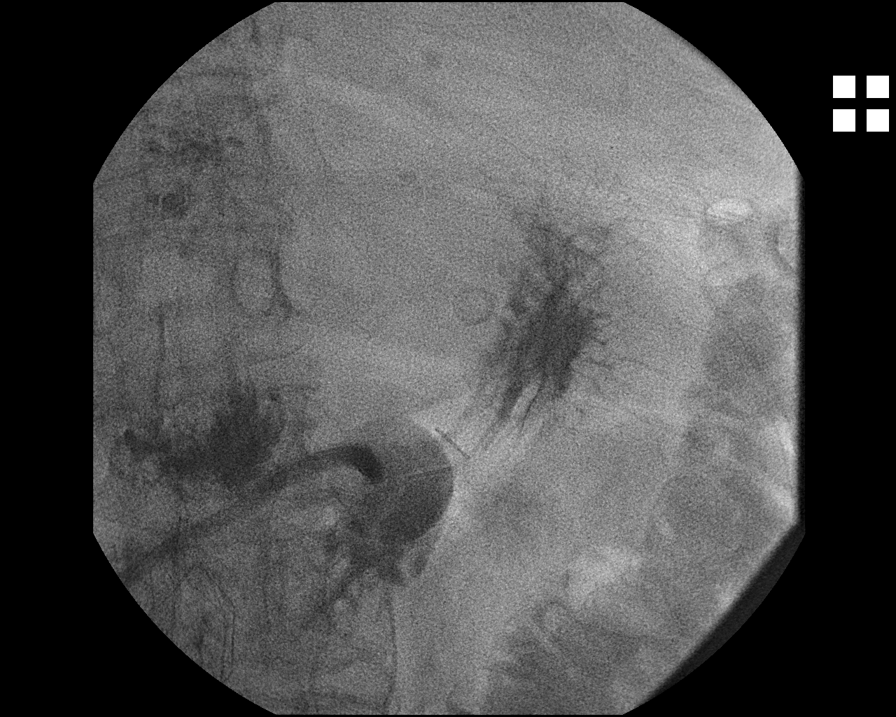
[im 5/8]
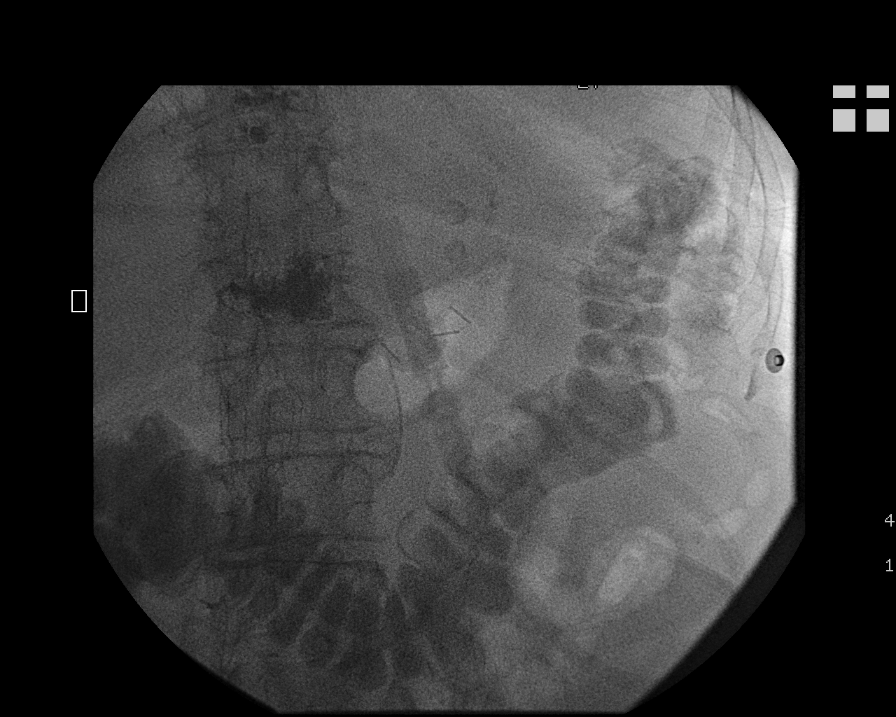
[im 6/8]
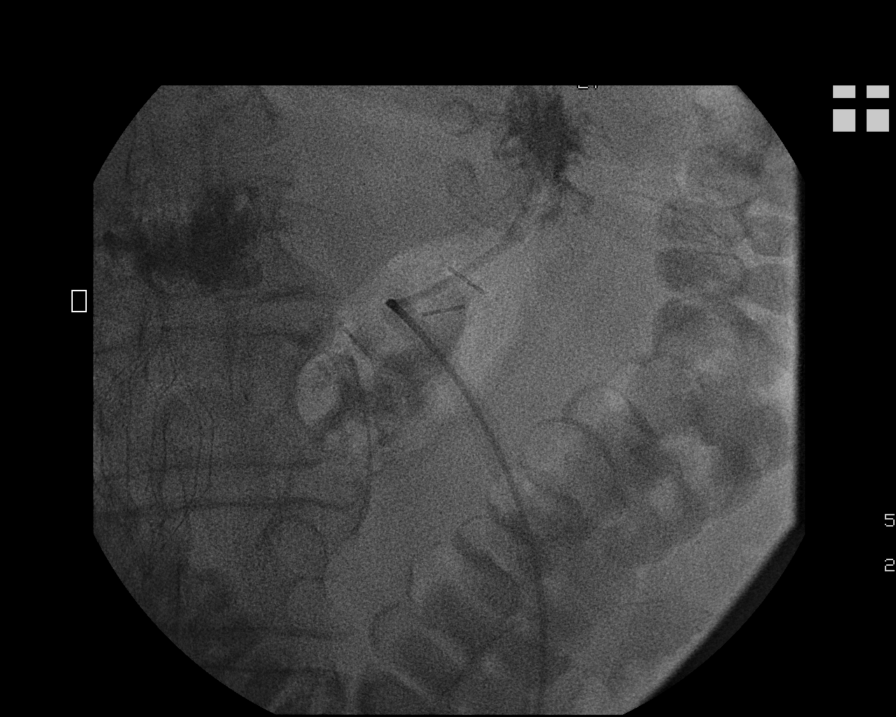
[im 7/8]
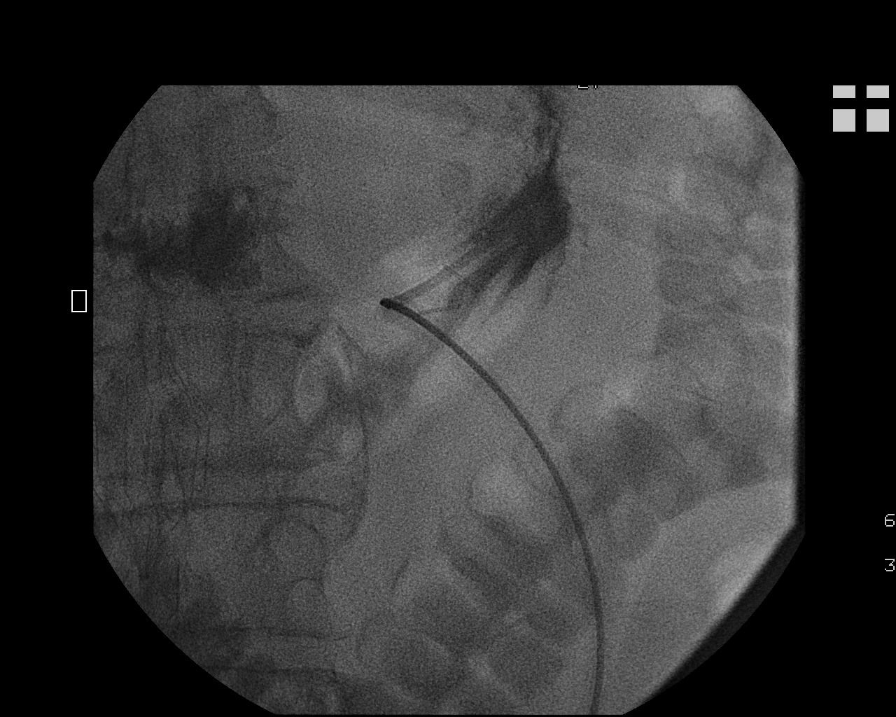
[im 8/8]
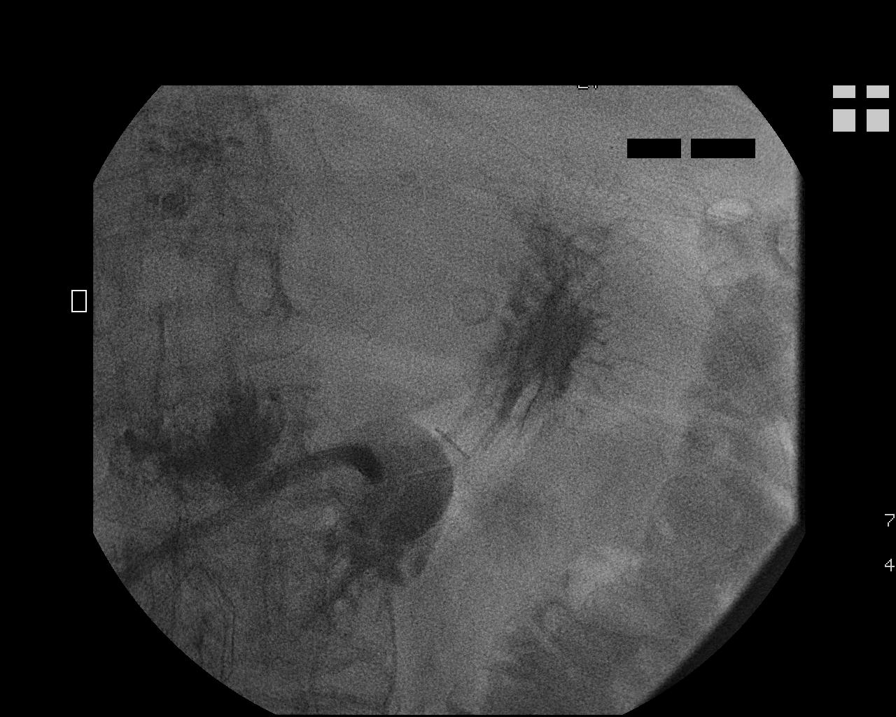

[8 of 8 positions shown; findings below may reference images not displayed]

MEDICATIONS:
None.

CONTRAST:  10mL OMNIPAQUE IOHEXOL 300 MG/ML SOLN administered into
the gastric lumen

FLUOROSCOPY TIME:  36 seconds (21 mGy)

COMPLICATIONS:
None immediate

PROCEDURE:
Informed written consent was obtained from the patient after a
discussion of the risks, benefits and alternatives to treatment.
Questions regarding the procedure were encouraged and answered. A
timeout was performed prior to the initiation of the procedure.

The upper abdomen and external portion of the existing gastrostomy
tube was prepped and draped in the usual sterile fashion, and a
sterile drape was applied covering the operative field. Maximum
barrier sterile technique with sterile gowns and gloves were used
for the procedure. A timeout was performed prior to the initiation
of the procedure.

The existing gastrostomy tube was injected with a small amount of
contrast confirming appropriate positioning within the gastric
lumen. The existing gastrostomy tube was cannulated with a stiff
Glidewire which was coiled within the gastric fundus. Over the stiff
guide wire, the existing gastrostomy tube was removed and exchanged
for a new 20-Lane Carmen balloon inflatable gastrostomy tube. The
balloon was inflated and disc was cinched. Contrast was injected and
several spot fluoroscopic images were obtained in various
obliquities to confirm appropriate intraluminal positioning. A
dressing was placed. The patient tolerated the procedure well
without immediate postprocedural complication.
IMPRESSION: Successful fluoroscopic guided replacement of a new 20-French
gastrostomy tube. The new gastrostomy tube is ready for immediate
use.

## 2017-05-18 IMAGING — CR DG CHEST 1V PORT
1 series · 1 of 1 positions shown · non-contrast
Comparison: 09/27/2014 and prior radiographs

CLINICAL DATA: Fluid overload

EXAM:
PORTABLE CHEST - 1 VIEW

[AP]
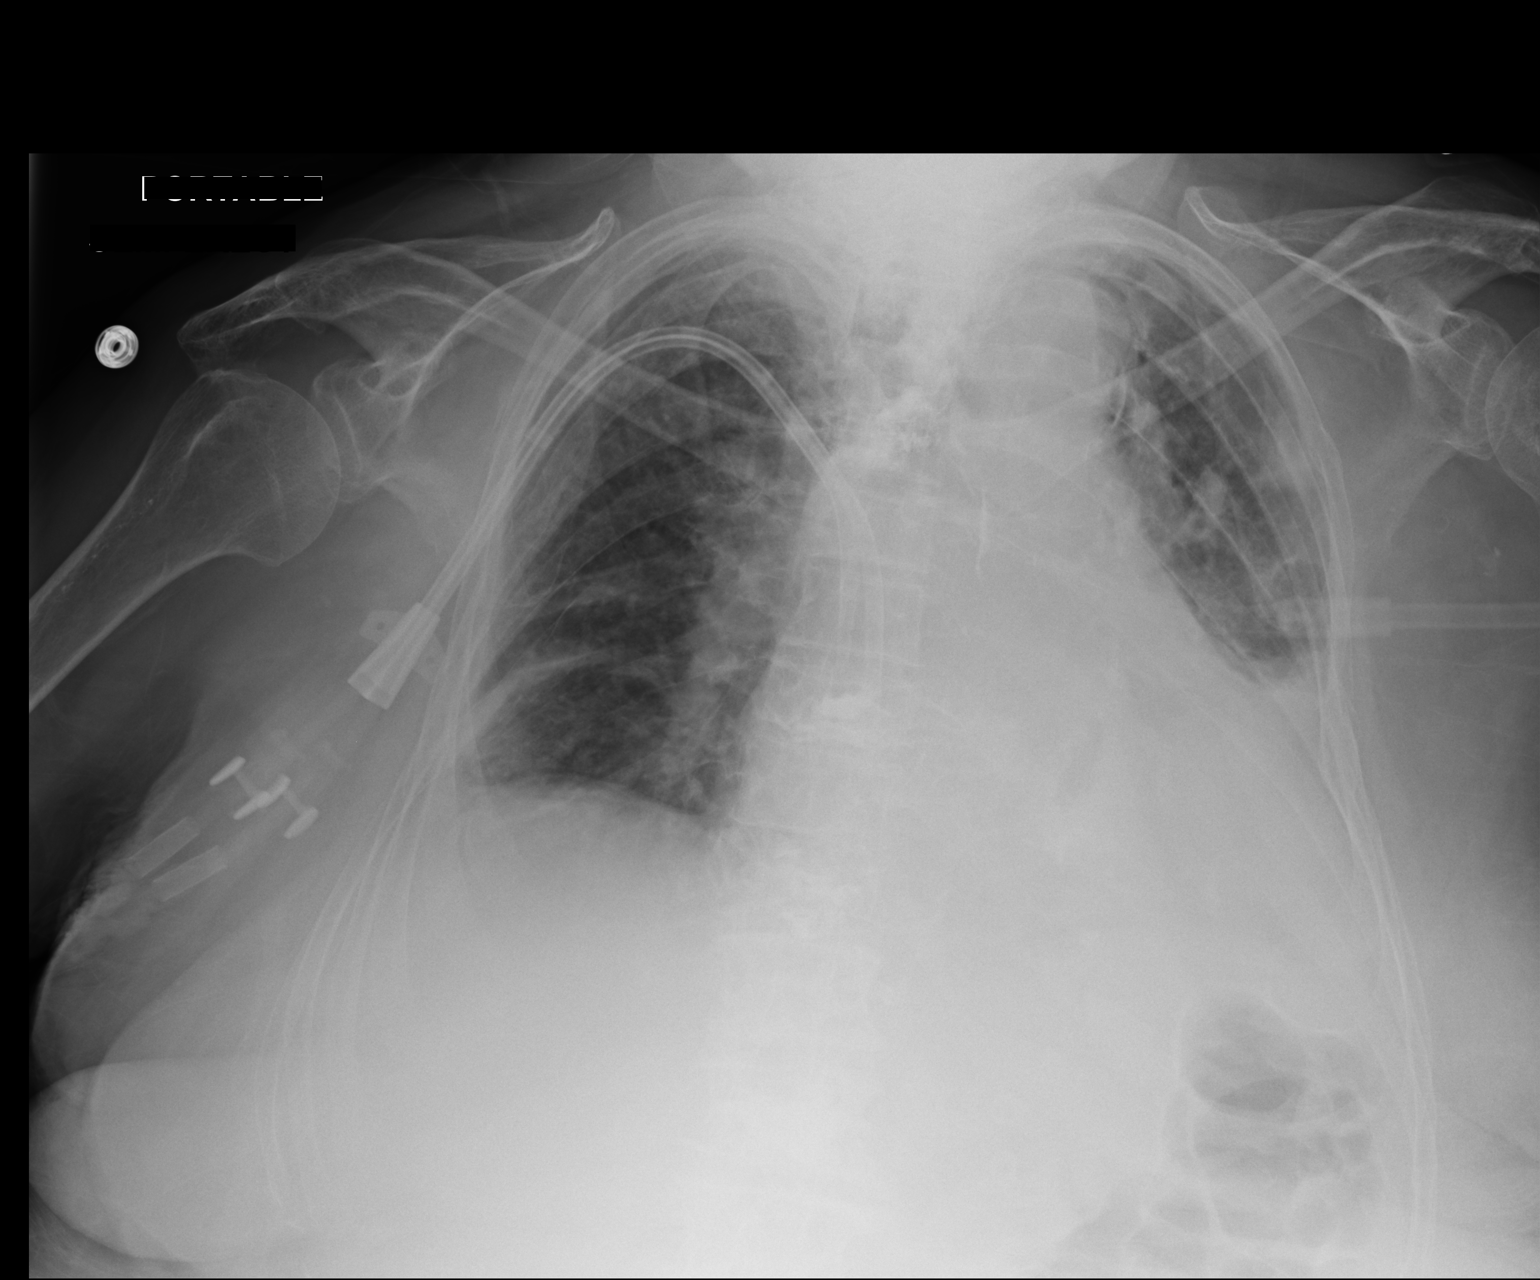

[1 of 1 positions shown; findings below may reference images not displayed]

FINDINGS: Cardiomegaly again noted.

A right central venous catheter with tips overlying the superior
cavoatrial junction again noted.

White vascular congestion is identified.

Small bilateral pleural effusions and left lower lung consolidation/
atelectasis again noted.

There is no evidence of pneumothorax.

Vertebral augmentation changes of multiple thoracic vertebra again
noted.
IMPRESSION: Little interval change with small bilateral pleural effusions, left
lower lung consolidation/atelectasis and pulmonary vascular
congestion.
